# Patient Record
Sex: Male | Born: 1955 | Race: White | Hispanic: No | Marital: Married | Smoking: Never smoker
Health system: Southern US, Community
[De-identification: ages and names within clinical notes are randomized; demographics above are authoritative.]

## PROBLEM LIST (undated history)

## (undated) DIAGNOSIS — E878 Other disorders of electrolyte and fluid balance, not elsewhere classified: Secondary | ICD-10-CM

## (undated) DIAGNOSIS — R42 Dizziness and giddiness: Secondary | ICD-10-CM

## (undated) DIAGNOSIS — E039 Hypothyroidism, unspecified: Secondary | ICD-10-CM

## (undated) DIAGNOSIS — E079 Disorder of thyroid, unspecified: Secondary | ICD-10-CM

## (undated) DIAGNOSIS — Z86718 Personal history of other venous thrombosis and embolism: Secondary | ICD-10-CM

## (undated) DIAGNOSIS — S36039A Unspecified laceration of spleen, initial encounter: Secondary | ICD-10-CM

## (undated) DIAGNOSIS — E7800 Pure hypercholesterolemia, unspecified: Secondary | ICD-10-CM

## (undated) HISTORY — DX: Dizziness and giddiness: R42

## (undated) HISTORY — DX: Other disorders of electrolyte and fluid balance, not elsewhere classified: E87.8

## (undated) HISTORY — DX: Personal history of other venous thrombosis and embolism: Z86.718

## (undated) HISTORY — PX: LUMBAR DISC SURGERY: SHX700

## (undated) HISTORY — DX: Unspecified laceration of spleen, initial encounter: S36.039A

## (undated) HISTORY — PX: CHOLECYSTECTOMY: SHX55

## (undated) HISTORY — DX: Disorder of thyroid, unspecified: E07.9

## (undated) HISTORY — PX: OTHER SURGICAL HISTORY: SHX169

## (undated) HISTORY — PX: CARPAL TUNNEL RELEASE: SHX101

## (undated) HISTORY — PX: COLONOSCOPY: SHX174

---

## 2001-05-21 ENCOUNTER — Ambulatory Visit (HOSPITAL_COMMUNITY): Admission: RE | Admit: 2001-05-21 | Discharge: 2001-05-21 | Payer: Self-pay | Admitting: Internal Medicine

## 2001-09-19 ENCOUNTER — Ambulatory Visit (HOSPITAL_BASED_OUTPATIENT_CLINIC_OR_DEPARTMENT_OTHER): Admission: RE | Admit: 2001-09-19 | Discharge: 2001-09-19 | Payer: Self-pay | Admitting: Orthopedic Surgery

## 2004-04-03 ENCOUNTER — Ambulatory Visit: Admission: RE | Admit: 2004-04-03 | Discharge: 2004-04-03 | Payer: Self-pay | Admitting: Internal Medicine

## 2004-05-31 ENCOUNTER — Ambulatory Visit (HOSPITAL_COMMUNITY): Admission: RE | Admit: 2004-05-31 | Discharge: 2004-06-01 | Payer: Self-pay | Admitting: Neurological Surgery

## 2007-02-24 ENCOUNTER — Ambulatory Visit (HOSPITAL_COMMUNITY): Admission: RE | Admit: 2007-02-24 | Discharge: 2007-02-24 | Payer: Self-pay | Admitting: Obstetrics and Gynecology

## 2007-03-12 ENCOUNTER — Inpatient Hospital Stay (HOSPITAL_COMMUNITY): Admission: EM | Admit: 2007-03-12 | Discharge: 2007-04-04 | Payer: Self-pay | Admitting: Emergency Medicine

## 2007-03-19 ENCOUNTER — Ambulatory Visit: Payer: Self-pay | Admitting: Vascular Surgery

## 2007-03-19 ENCOUNTER — Encounter (INDEPENDENT_AMBULATORY_CARE_PROVIDER_SITE_OTHER): Payer: Self-pay | Admitting: General Surgery

## 2007-04-30 ENCOUNTER — Encounter (HOSPITAL_COMMUNITY): Admission: RE | Admit: 2007-04-30 | Discharge: 2007-05-30 | Payer: Self-pay | Admitting: Orthopedic Surgery

## 2007-05-16 ENCOUNTER — Ambulatory Visit (HOSPITAL_COMMUNITY): Admission: RE | Admit: 2007-05-16 | Discharge: 2007-05-16 | Payer: Self-pay | Admitting: Internal Medicine

## 2007-05-22 ENCOUNTER — Encounter: Admission: RE | Admit: 2007-05-22 | Discharge: 2007-05-22 | Payer: Self-pay | Admitting: Internal Medicine

## 2007-05-28 ENCOUNTER — Inpatient Hospital Stay (HOSPITAL_COMMUNITY): Admission: AD | Admit: 2007-05-28 | Discharge: 2007-05-30 | Payer: Self-pay | Admitting: Interventional Radiology

## 2007-06-04 ENCOUNTER — Observation Stay (HOSPITAL_COMMUNITY): Admission: AD | Admit: 2007-06-04 | Discharge: 2007-06-06 | Payer: Self-pay | Admitting: Internal Medicine

## 2007-06-11 ENCOUNTER — Encounter: Admission: RE | Admit: 2007-06-11 | Discharge: 2007-06-11 | Payer: Self-pay | Admitting: Interventional Radiology

## 2007-07-10 ENCOUNTER — Encounter (HOSPITAL_COMMUNITY): Admission: RE | Admit: 2007-07-10 | Discharge: 2007-07-30 | Payer: Self-pay | Admitting: Orthopedic Surgery

## 2007-08-01 ENCOUNTER — Encounter (HOSPITAL_COMMUNITY): Admission: RE | Admit: 2007-08-01 | Discharge: 2007-08-31 | Payer: Self-pay | Admitting: Orthopedic Surgery
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
## 2009-01-09 ENCOUNTER — Emergency Department (HOSPITAL_COMMUNITY): Admission: EM | Admit: 2009-01-09 | Discharge: 2009-01-09 | Payer: Self-pay | Admitting: Emergency Medicine

## 2009-01-10 ENCOUNTER — Inpatient Hospital Stay (HOSPITAL_COMMUNITY): Admission: EM | Admit: 2009-01-10 | Discharge: 2009-01-14 | Payer: Self-pay | Admitting: Emergency Medicine

## 2009-01-10 ENCOUNTER — Ambulatory Visit (HOSPITAL_COMMUNITY): Admission: RE | Admit: 2009-01-10 | Discharge: 2009-01-10 | Payer: Self-pay | Admitting: Emergency Medicine

## 2009-01-12 ENCOUNTER — Encounter (INDEPENDENT_AMBULATORY_CARE_PROVIDER_SITE_OTHER): Payer: Self-pay | Admitting: Surgery

## 2010-05-26 ENCOUNTER — Ambulatory Visit (HOSPITAL_COMMUNITY): Admission: RE | Admit: 2010-05-26 | Discharge: 2010-05-26 | Payer: Self-pay | Admitting: Internal Medicine

## 2010-11-20 ENCOUNTER — Encounter: Payer: Self-pay | Admitting: Internal Medicine

## 2011-02-09 LAB — CBC
HCT: 38.4 % — ABNORMAL LOW (ref 39.0–52.0)
HCT: 41.6 % (ref 39.0–52.0)
Hemoglobin: 11.1 g/dL — ABNORMAL LOW (ref 13.0–17.0)
Hemoglobin: 11.5 g/dL — ABNORMAL LOW (ref 13.0–17.0)
Hemoglobin: 12.5 g/dL — ABNORMAL LOW (ref 13.0–17.0)
MCHC: 35.3 g/dL (ref 30.0–36.0)
MCHC: 35.5 g/dL (ref 30.0–36.0)
MCHC: 34.8 g/dL (ref 30.0–36.0)
MCV: 98.7 fL (ref 78.0–100.0)
MCV: 99.6 fL (ref 78.0–100.0)
MCV: 100.4 fL — ABNORMAL HIGH (ref 78.0–100.0)
Platelets: 178 10*3/uL (ref 150–400)
Platelets: 179 10*3/uL (ref 150–400)
Platelets: 179 10*3/uL (ref 150–400)
RBC: 3.15 MIL/uL — ABNORMAL LOW (ref 4.22–5.81)
RBC: 3.54 MIL/uL — ABNORMAL LOW (ref 4.22–5.81)
RBC: 3.83 MIL/uL — ABNORMAL LOW (ref 4.22–5.81)
RDW: 12.4 % (ref 11.5–15.5)
RDW: 12.7 % (ref 11.5–15.5)
RDW: 12.4 % (ref 11.5–15.5)
WBC: 5.6 10*3/uL (ref 4.0–10.5)
WBC: 11.8 10*3/uL — ABNORMAL HIGH (ref 4.0–10.5)
WBC: 10.4 10*3/uL (ref 4.0–10.5)

## 2011-02-09 LAB — LIPASE, BLOOD: Lipase: 13 U/L (ref 11–59)

## 2011-02-09 LAB — COMPREHENSIVE METABOLIC PANEL
ALT: 57 U/L — ABNORMAL HIGH (ref 0–53)
ALT: 21 U/L (ref 0–53)
Albumin: 3.1 g/dL — ABNORMAL LOW (ref 3.5–5.2)
Albumin: 3.6 g/dL (ref 3.5–5.2)
Alkaline Phosphatase: 74 U/L (ref 39–117)
Alkaline Phosphatase: 46 U/L (ref 39–117)
Alkaline Phosphatase: 48 U/L (ref 39–117)
BUN: 14 mg/dL (ref 6–23)
CO2: 27 mEq/L (ref 19–32)
Calcium: 8.4 mg/dL (ref 8.4–10.5)
Calcium: 8.6 mg/dL (ref 8.4–10.5)
Creatinine, Ser: 1.24 mg/dL (ref 0.4–1.5)
GFR calc Af Amer: >60 mL/min (ref >60)
GFR calc non Af Amer: >60 mL/min (ref >60)
Glucose, Bld: 105 mg/dL — ABNORMAL HIGH (ref 70–99)
Potassium: 3.3 mEq/L — ABNORMAL LOW (ref 3.5–5.1)
Potassium: 3.6 mEq/L (ref 3.5–5.1)
Potassium: 4.2 mEq/L (ref 3.5–5.1)
Sodium: 140 mEq/L (ref 135–145)
Sodium: 137 mEq/L (ref 135–145)
Total Protein: 5.7 g/dL — ABNORMAL LOW (ref 6.0–8.3)
Total Protein: 6.9 g/dL (ref 6.0–8.3)
Total Protein: 7.4 g/dL (ref 6.0–8.3)

## 2011-02-09 LAB — URINALYSIS, ROUTINE W REFLEX MICROSCOPIC
Glucose, UA: NEGATIVE mg/dL
Ketones, ur: NEGATIVE mg/dL
Leukocytes, UA: NEGATIVE
Nitrite: NEGATIVE
Protein, ur: NEGATIVE mg/dL
pH: 5.5 (ref 5.0–8.0)

## 2011-02-09 LAB — PREPARE FRESH FROZEN PLASMA

## 2011-02-09 LAB — BASIC METABOLIC PANEL
BUN: 10 mg/dL (ref 6–23)
Creatinine, Ser: 0.87 mg/dL (ref 0.4–1.5)
GFR calc non Af Amer: >60 mL/min (ref >60)
Glucose, Bld: 149 mg/dL — ABNORMAL HIGH (ref 70–99)

## 2011-02-09 LAB — DIFFERENTIAL
Basophils Absolute: 0 10*3/uL (ref 0.0–0.1)
Eosinophils Absolute: 0 10*3/uL (ref 0.0–0.7)
Eosinophils Relative: 0 % (ref 0–5)
Lymphocytes Relative: 11 % — ABNORMAL LOW (ref 12–46)
Lymphocytes Relative: 14 % (ref 12–46)
Lymphs Abs: 1.5 10*3/uL (ref 0.7–4.0)
Lymphs Abs: 1.4 10*3/uL (ref 0.7–4.0)
Monocytes Absolute: 1.4 10*3/uL — ABNORMAL HIGH (ref 0.1–1.0)
Monocytes Relative: 10 % (ref 3–12)
Neutro Abs: 11.2 10*3/uL — ABNORMAL HIGH (ref 1.7–7.7)
Neutrophils Relative %: 79 % — ABNORMAL HIGH (ref 43–77)
Neutrophils Relative %: 76 % (ref 43–77)

## 2011-02-09 LAB — APTT
aPTT: 55 seconds — ABNORMAL HIGH (ref 24–37)
aPTT: 58 seconds — ABNORMAL HIGH (ref 24–37)

## 2011-02-09 LAB — PROTIME-INR
INR: 1.8 — ABNORMAL HIGH (ref 0.00–1.49)
INR: 2.4 — ABNORMAL HIGH (ref 0.00–1.49)
Prothrombin Time: 21.6 seconds — ABNORMAL HIGH (ref 11.6–15.2)
Prothrombin Time: 26 seconds — ABNORMAL HIGH (ref 11.6–15.2)

## 2011-02-09 LAB — AMYLASE: Amylase: 20 U/L — ABNORMAL LOW (ref 27–131)

## 2011-02-09 LAB — URINE MICROSCOPIC-ADD ON
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
## 2011-03-14 NOTE — Discharge Summary (Signed)
[name], [name]             ACCOUNT NO.:  [id number]   MEDICAL RECORD NO.:  [id number]          PATIENT TYPE:  OBV   LOCATION:  A327                          FACILITY:  [facility]   PHYSICIAN:  Kingsley Callander. Ouida Sills, MD       DATE OF BIRTH:  [birth year]   DATE OF ADMISSION:  06/04/2007  DATE OF DISCHARGE:  08/07/2008LH                               DISCHARGE SUMMARY   DISCHARGE DIAGNOSES:  1. Anemia.  2. Left chest wall hematoma.  3. 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  4. Hypothyroidism.  5. History of urinary retention.  6. History of the left rib scapula and clavicle fractures.  7. Sleep apnea.  8. Hyperlipidemia.   PROCEDURES:  CT of chest, abdomen and pelvis.   HOSPITAL COURSE:  This patient is a 55 year old white male who is 1 week  out from thrombectomy and thrombolysis for lower extremity iliac and IVC  DVT who presented with an anemia.  His hemoglobin was 5.8.  Coumadin was  held.  He was transfused initially with 3 units of packed red cells.  His hemoglobin improved to 7.3.  He was transfused with 2 more units of  packed red cells up to a hemoglobin of 9.0.  His INR initially was 3.6  and dropped to 3.0, then to 2.0.  A CT scan of the abdomen and pelvis  revealed no sign of retroperitoneal hemorrhage.  A CT scan of the chest  revealed a large chest wall hematoma in the lateral and posterior left  chest wall.  He had previously suffered fractures of the left 2nd  through 10th ribs, as well as fractures of the scapula and clavicle.  He  was hemodynamically stable.  He developed yellow discoloration type  bruising in the left lateral chest.  He has had significant improvement  in his lower extremity edema since his procedures last week.  He has had  no bleeding from his popliteal areas where the catheters had been  placed.  The plan is to try to keep his INR in a low therapeutic range.  He will have a followup hemoglobin tomorrow.  He will be  discharged  today.  An INR an H&H will be obtained tomorrow.   DISCHARGE MEDICATIONS:  1. Coumadin 7.5 mg daily.  2. Synthroid 175 mcg daily.  3. Flomax 0.4 mg daily.  4. Avodart 0.5 mg daily.  5. Repliva daily.  6. Multivitamin daily.  7. Robaxin as needed.      Kingsley Callander. Ouida Sills, MD  Electronically Signed     ROF/MEDQ  D:  06/06/2007  T:  06/06/2007  Job:  [id number]   cc:   Sherrine Maples T. Fredia Sorrow, M.D.  Fax: [phone]   Kingsley Callander. Ouida Sills, MD  Fax: [phone]   Doralee Albino. Carola Frost, M.D.  Fax: [phone]
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
## 2011-03-14 NOTE — Op Note (Signed)
[name], [name] NO.:  [id number]   MEDICAL RECORD NO.:  [id number]           PATIENT TYPE:   LOCATION:                                 FACILITY:   PHYSICIAN:  Currie Paris, M.D.DATE OF BIRTH:  [birth year]   DATE OF PROCEDURE:  01/12/2009  DATE OF DISCHARGE:                               OPERATIVE REPORT   PREOPERATIVE DIAGNOSIS:  Acute calculous cholecystitis.   POSTOPERATIVE DIAGNOSIS:  Acute calculous cholecystitis.   OPERATION:  Laparoscopic cholecystectomy with operative cholangiogram.   SURGEON:  Currie Paris, MD   ASSISTANT:  Dr. Janee Morn.   ANESTHESIA:  General endotracheal.   CLINICAL HISTORY:  This is a 55 year old gentleman presented with signs  and symptoms of acute cholecystitis.  Because of a history of DVT, he  has been anticoagulated on Coumadin.  We got his INR corrected and  brought him to the operating room today.   DESCRIPTION OF PROCEDURE:  The patient was seen in the holding area and  he had no further questions.  We confirmed cholecystectomy as the  planned procedure.   The patient was taken to the operating room.  After satisfactory general  endotracheal anesthesia had been obtained, the abdomen was prepped and  draped.  The time-out was done.   I used 0.25% plain Marcaine for each incision.  An incision was made  just above the umbilicus because the patient was quite large and I  thought this would gave [place] more access with the camera.  The fascia was  opened under direct vision, and the peroneal cavity entered.  A  pursestring was placed.  The abdomen was insufflated to 15.   The patient was placed in reverse Trendelenburg and tilted to the left.  The omentum was noted to be stuck to the gallbladder, and I could see  the tip of the gallbladder which appeared gangrenous.   Using a combination of blunt and irrigation, I was able to get the  omentum off, although it was initially bleeding.  Once I got the  gallbladder visualized, I was able to decompress it.  With that done, I  was able to then retract it up over the liver and began to try to  dissect in the area of the triangle of Calot.  This area was very woody,  and we had difficulty grasping it here because it felt like there was a  stone in the ampulla of the gallbladder.  Nevertheless, I was able open  the very thickened peritoneum which was also somewhat necrotic both  anteriorly and posteriorly.  Then using very slow dissection with  suction and irrigation, finally I was able to dissect out a nice segment  of cystic duct, although it appeared almost necrotic.  Once I had its  junction with the gallbladder clearly identified and I had a segment  about 2.5 to 3 cm long, I ceased any further dissection and did not try  to dissect down further towards the common duct.  At this point, I had  not identified the artery as I did have a lot of mobility to dissect  further up in the triangle of Calot.  I went ahead and clipped the  cystic duct and divided it.  I then put an Endoloop on the proximal end  of the cystic duct as a second secure closure.   With that done and further hydrodissection, I was able to bring the  gallbladder out of its bed, although again this was fairly necrotic.  I  saw what appeared initially to be a posterior branch of the artery which  I clipped and divided and then what appeared to be the anterior branch  which I clipped and divided.  The gallbladder was then removed from  below to above with some destruction of the wall as this was necrotic as  noted above.   Once I had it out, I placed it in a bag.  I spent several minutes  irrigating and making sure everything was dry.  There had been a fair  amount of oozing near the dissection initially around the cystic duct  which had caused some clots to occur, but we got all those sucked out  and everything looked dry.  I did go ahead and put some Surgicel in the  area  where there had been oozing previously.   The gallbladder was brought out the umbilical site.  I had to enlarge it  in order to get it out and then closed it with figure-of-eight 0  Prolene.   We reinsufflated and made a final check and everything appeared dry  around the bed of the gallbladder.  I did place a 19 Blake drain and  bring it out the lateral port prior to removing the umbilical port.   The lateral port remaining was removed.  The abdomen was deflated  through the epigastric port.  Skin was closed with 4-0 Monocryl  subcuticular plus Dermabond.   The patient tolerated the procedure well.  There were no operative  complications.  All counts were correct.      Currie Paris, M.D.  Electronically Signed     CJS/MEDQ  D:  01/12/2009  T:  01/13/2009  Job:  [id number]
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
## 2011-03-17 NOTE — Procedures (Signed)
NAME:  [name], [name] NO.:  [id number]   MEDICAL RECORD NO.:  [id number]          PATIENT TYPE:  OUT   LOCATION:  SLEEP LAB                     FACILITY:  [facility]   PHYSICIAN:  Marcelyn Bruins, M.D. [facility] DATE OF BIRTH:  [birth year]   DATE OF ADMISSION:  04/03/2004  DATE OF DISCHARGE:  04/03/2004                              NOCTURNAL POLYSOMNOGRAM   REFERRING PHYSICIAN:  Dr. Carylon Perches   INDICATION FOR STUDY:  Hypersomnia with sleep apnea.  Epworth sleepiness  score is 10.   SLEEP ARCHITECTURE:  Total sleep time is 390 minutes.  The patient had a  normal sleep latency at 9.5 minutes and a prolonged REM latency at 124  minutes.  There was decreased REM and slow-wave sleep.   RESPIRATORY DATA:  The patient was found to have 163 obstructive events in  the first 82 minutes of sleep.  This gave him a respiratory disturbance  index of 120 events per hour.  The patient, therefore, met split night  protocol and was initiated on CPAP with a medium comfort select gel mask.  CPAP was then titrated to a final pressure of 12 cm with mild REM  breakthrough events.   OXYGEN DATA:  Minimum O2 desaturation was 84%.   IMPRESSION/RECOMMENDATIONS:  1. Very severe obstructive sleep apnea, hypopnea syndrome, which appears to     respond very well to CPAP.  Because of the REM breakthroughs, I would     recommend a final treatment pressure of 13 cm.  Events were not     positional during the study and there was mild O2 desaturation noted.   2.  Very loud snoring prior to CPAP initiation.   3.  Very large numbers of leg jerks with minimal sleep disruption.  Clinical  correlation is suggested.   4.  Occasional PVC noted.                                   ______________________________                                Marcelyn Bruins, M.D. [facility]    KC/MEDQ  D:  05/12/2004 15:32:54  T:  05/13/2004 15:30:33  Job:  [id number]

## 2011-08-14 LAB — CROSSMATCH: ABO/RH(D): A POS

## 2011-08-14 LAB — CBC
Hemoglobin: 8.9 — ABNORMAL LOW
Hemoglobin: 9.7 — ABNORMAL LOW
MCHC: 33
MCHC: 33.4
MCHC: 33.7
MCV: 93.3
Platelets: 270
Platelets: 276
Platelets: 298
Platelets: 300
Platelets: 313
RBC: 2.86 — ABNORMAL LOW
RBC: 2.99 — ABNORMAL LOW
RBC: 3.03 — ABNORMAL LOW
RBC: 3.04 — ABNORMAL LOW
RBC: 3.19 — ABNORMAL LOW
RBC: 3.3 — ABNORMAL LOW
RDW: 18.5 — ABNORMAL HIGH
RDW: 18.8 — ABNORMAL HIGH
RDW: 19.2 — ABNORMAL HIGH
WBC: 4.8
WBC: 5
WBC: 5.2
WBC: 5.4
WBC: 5.9

## 2011-08-14 LAB — FIBRINOGEN
Fibrinogen: 272
Fibrinogen: 286
Fibrinogen: 320
Fibrinogen: 367
Fibrinogen: 372
Fibrinogen: 391

## 2011-08-14 LAB — COMPREHENSIVE METABOLIC PANEL
Alkaline Phosphatase: 70
Calcium: 9
Chloride: 107
Creatinine, Ser: 0.73
GFR calc Af Amer: >60
GFR calc non Af Amer: >60
Sodium: 142

## 2011-08-14 LAB — POTASSIUM: Potassium: 3.6

## 2011-08-14 LAB — PROTIME-INR
INR: 2 — ABNORMAL HIGH
INR: 2.7 — ABNORMAL HIGH
INR: 2.8 — ABNORMAL HIGH
Prothrombin Time: 24 — ABNORMAL HIGH
Prothrombin Time: 30.2 — ABNORMAL HIGH

## 2011-08-14 LAB — HEPARIN LEVEL (UNFRACTIONATED)
Heparin Unfractionated: 0.12 — ABNORMAL LOW
Heparin Unfractionated: 0.12 — ABNORMAL LOW
Heparin Unfractionated: 0.15 — ABNORMAL LOW
Heparin Unfractionated: 0.15 — ABNORMAL LOW
Heparin Unfractionated: 0.19 — ABNORMAL LOW

## 2011-08-14 LAB — HEMOGLOBIN AND HEMATOCRIT, BLOOD
HCT: 21 — ABNORMAL LOW
HCT: 26.1 — ABNORMAL LOW
Hemoglobin: 7.3 — CL
Hemoglobin: 9 — ABNORMAL LOW

## 2011-08-14 LAB — ABO/RH: ABO/RH(D): A POS

## 2011-08-14 LAB — BASIC METABOLIC PANEL
BUN: 4 — ABNORMAL LOW
Calcium: 8.3 — ABNORMAL LOW
Creatinine, Ser: 0.79
GFR calc Af Amer: >60
GFR calc non Af Amer: >60

## 2011-08-14 LAB — CREATININE, SERUM
Creatinine, Ser: 0.75
GFR calc non Af Amer: >60

## 2011-08-14 LAB — PREPARE RBC (CROSSMATCH)

## 2011-08-17 LAB — BASIC METABOLIC PANEL
BUN: 35 — ABNORMAL HIGH
CO2: 19
Calcium: 8.4
Chloride: 104
Creatinine, Ser: 2.26 — ABNORMAL HIGH
GFR calc non Af Amer: 31 — ABNORMAL LOW
GFR calc non Af Amer: >60
Glucose, Bld: 100 — ABNORMAL HIGH
Glucose, Bld: 112 — ABNORMAL HIGH
Glucose, Bld: 140 — ABNORMAL HIGH
Potassium: 3.6
Potassium: 4.1
Sodium: 130 — ABNORMAL LOW
Sodium: 134 — ABNORMAL LOW

## 2011-08-17 LAB — RENAL FUNCTION PANEL
Albumin: 2.5 — ABNORMAL LOW
Albumin: 2.6 — ABNORMAL LOW
BUN: 31 — ABNORMAL HIGH
CO2: 21
Calcium: 8.3 — ABNORMAL LOW
GFR calc Af Amer: >60
GFR calc non Af Amer: 59 — ABNORMAL LOW
Phosphorus: 5.8 — ABNORMAL HIGH
Potassium: 3.5
Sodium: 132 — ABNORMAL LOW
Sodium: 133 — ABNORMAL LOW

## 2011-08-17 LAB — CBC
HCT: 25.4 — ABNORMAL LOW
HCT: 26.7 — ABNORMAL LOW
HCT: 28.4 — ABNORMAL LOW
Hemoglobin: 8.8 — ABNORMAL LOW
Hemoglobin: 9.4 — ABNORMAL LOW
Hemoglobin: 9.9 — ABNORMAL LOW
MCHC: 34
MCHC: 34.8
MCHC: 35
MCV: 94
Platelets: 200
RBC: 2.79 — ABNORMAL LOW
RBC: 2.86 — ABNORMAL LOW
RDW: 14
RDW: 14
RDW: 14.2 — ABNORMAL HIGH
WBC: 8.7
WBC: 9.6

## 2012-03-26 ENCOUNTER — Ambulatory Visit (INDEPENDENT_AMBULATORY_CARE_PROVIDER_SITE_OTHER): Payer: 59 | Admitting: Internal Medicine

## 2012-03-26 ENCOUNTER — Encounter (INDEPENDENT_AMBULATORY_CARE_PROVIDER_SITE_OTHER): Payer: Self-pay | Admitting: Internal Medicine

## 2012-03-26 VITALS — BP 140/90 | HR 82 | Temp 98.4°F | Resp 20 | Ht 74.0 in | Wt 294.8 lb

## 2012-03-26 DIAGNOSIS — I82401 Acute embolism and thrombosis of unspecified deep veins of right lower extremity: Secondary | ICD-10-CM | POA: Insufficient documentation

## 2012-03-26 DIAGNOSIS — E039 Hypothyroidism, unspecified: Secondary | ICD-10-CM | POA: Insufficient documentation

## 2012-03-26 DIAGNOSIS — R197 Diarrhea, unspecified: Secondary | ICD-10-CM | POA: Insufficient documentation

## 2012-03-26 MED ORDER — DICYCLOMINE HCL 10 MG PO CAPS: 10.0000 mg | ORAL_CAPSULE | Freq: Three times a day (TID) | ORAL | Status: DC

## 2012-03-26 MED ORDER — ALIGN PO CAPS: 1.0000 | ORAL_CAPSULE | Freq: Every day | ORAL | Status: AC
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
## 2012-03-26 NOTE — Patient Instructions (Signed)
Align 1 capsule by mouth daily. New medication is dicyclomine 10 mg by mouth 30 minutes before each meal. Colonoscopy to be scheduled
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
## 2012-03-27 ENCOUNTER — Telehealth (INDEPENDENT_AMBULATORY_CARE_PROVIDER_SITE_OTHER): Payer: Self-pay | Admitting: *Deleted

## 2012-03-27 ENCOUNTER — Other Ambulatory Visit (INDEPENDENT_AMBULATORY_CARE_PROVIDER_SITE_OTHER): Payer: Self-pay | Admitting: *Deleted

## 2012-03-27 DIAGNOSIS — Z1211 Encounter for screening for malignant neoplasm of colon: Secondary | ICD-10-CM

## 2012-03-27 DIAGNOSIS — R197 Diarrhea, unspecified: Secondary | ICD-10-CM

## 2012-03-27 NOTE — Telephone Encounter (Signed)
Patient needs trilyte 

## 2012-03-28 MED ORDER — PEG 3350-KCL-NA BICARB-NACL 420 G PO SOLR: 4000.0000 mL | Freq: Once | ORAL | Status: AC

## 2012-04-01 ENCOUNTER — Encounter (INDEPENDENT_AMBULATORY_CARE_PROVIDER_SITE_OTHER): Payer: Self-pay

## 2012-04-11 ENCOUNTER — Encounter (HOSPITAL_COMMUNITY): Payer: Self-pay | Admitting: Pharmacy Technician

## 2012-04-17 MED ORDER — SODIUM CHLORIDE 0.45 % IV SOLN: Freq: Once | INTRAVENOUS | Status: DC

## 2012-04-18 ENCOUNTER — Encounter (HOSPITAL_COMMUNITY)
Admission: RE | Disposition: A | Discharge status: Home / Self Care | Payer: Self-pay | Source: Ambulatory Visit | Attending: Internal Medicine

## 2012-04-18 ENCOUNTER — Encounter (HOSPITAL_COMMUNITY): Payer: Self-pay | Admitting: *Deleted

## 2012-04-18 ENCOUNTER — Ambulatory Visit (HOSPITAL_COMMUNITY)
Admission: RE | Admit: 2012-04-18 | Discharge: 2012-04-18 | Disposition: A | Discharge status: Home / Self Care | Payer: 59 | Source: Ambulatory Visit | Attending: Internal Medicine | Admitting: Internal Medicine

## 2012-04-18 DIAGNOSIS — D126 Benign neoplasm of colon, unspecified: Secondary | ICD-10-CM

## 2012-04-18 DIAGNOSIS — R197 Diarrhea, unspecified: Secondary | ICD-10-CM | POA: Insufficient documentation

## 2012-04-18 DIAGNOSIS — E7800 Pure hypercholesterolemia, unspecified: Secondary | ICD-10-CM | POA: Insufficient documentation

## 2012-04-18 HISTORY — DX: Pure hypercholesterolemia, unspecified: E78.00

## 2012-04-18 HISTORY — PX: COLONOSCOPY: SHX5424

## 2012-04-18 HISTORY — DX: Hypothyroidism, unspecified: E03.9

## 2012-04-18 SURGERY — COLONOSCOPY
Anesthesia: Moderate Sedation

## 2012-04-18 MED ORDER — MEPERIDINE HCL 50 MG/ML IJ SOLN
INTRAMUSCULAR | Status: DC | PRN
  Administered 2012-04-18 (×2): 25 mg via INTRAVENOUS

## 2012-04-18 MED ORDER — MIDAZOLAM HCL 5 MG/5ML IJ SOLN
INTRAMUSCULAR | Status: AC
  Filled 2012-04-18: qty 10

## 2012-04-18 MED ORDER — MIDAZOLAM HCL 5 MG/5ML IJ SOLN
INTRAMUSCULAR | Status: DC | PRN
  Administered 2012-04-18 (×3): 2 mg via INTRAVENOUS

## 2012-04-18 MED ORDER — STERILE WATER FOR IRRIGATION IR SOLN
Status: DC | PRN
  Administered 2012-04-18: 14:00:00

## 2012-04-18 MED ORDER — MEPERIDINE HCL 50 MG/ML IJ SOLN
INTRAMUSCULAR | Status: AC
  Filled 2012-04-18: qty 1
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
## 2012-04-18 NOTE — Discharge Instructions (Signed)
Resume usual medications and diet including warfarin and Lovenox. No driving for 24 hours. Physician will contact you with biopsy results   Colonoscopy Care After Read the instructions outlined below and refer to this sheet in the next few weeks. These discharge instructions provide you with general information on caring for yourself after you leave the hospital. Your doctor may also give you specific instructions. While your treatment has been planned according to the most current medical practices available, unavoidable complications occasionally occur. If you have any problems or questions after discharge, call your doctor. HOME CARE INSTRUCTIONS ACTIVITY:  You may resume your regular activity, but move at a slower pace for the next 24 hours.   Take frequent rest periods for the next 24 hours.   Walking will help get rid of the air and reduce the bloated feeling in your belly (abdomen).   No driving for 24 hours (because of the medicine (anesthesia) used during the test).   You may shower.   Do not sign any important legal documents or operate any machinery for 24 hours (because of the anesthesia used during the test).  NUTRITION:  Drink plenty of fluids.   You may resume your normal diet as instructed by your doctor.   Begin with a light meal and progress to your normal diet. Heavy or fried foods are harder to digest and may make you feel sick to your stomach (nauseated).   Avoid alcoholic beverages for 24 hours or as instructed.  MEDICATIONS:  You may resume your normal medications unless your doctor tells you otherwise.  WHAT TO EXPECT TODAY:  Some feelings of bloating in the abdomen.   Passage of more gas than usual.   Spotting of blood in your stool or on the toilet paper.  IF YOU HAD POLYPS REMOVED DURING THE COLONOSCOPY:  No aspirin products for 7 days or as instructed.   No alcohol for 7 days or as instructed.   Eat a soft diet for the next 24 hours.    FINDING OUT THE RESULTS OF YOUR TEST Not all test results are available during your visit. If your test results are not back during the visit, make an appointment with your caregiver to find out the results. Do not assume everything is normal if you have not heard from your caregiver or the medical facility. It is important for you to follow up on all of your test results.  SEEK IMMEDIATE MEDICAL CARE IF:  You have more than a spotting of blood in your stool.   Your belly is swollen (abdominal distention).   You are nauseated or vomiting.   You have a fever.   You have abdominal pain or discomfort that is severe or gets worse throughout the day.  Document Released: 05/30/2004 Document Revised: 10/05/2011 Document Reviewed: 05/28/2008 [facility] Patient Information 2012 [place], [place].   PATIENT INSTRUCTIONS POST-ANESTHESIA  IMMEDIATELY FOLLOWING SURGERY:  Do not drive or operate machinery for the first twenty four hours after surgery.  Do not make any important decisions for twenty four hours after surgery or while taking narcotic pain medications or sedatives.  If you develop intractable nausea and vomiting or a severe headache please notify your doctor immediately.  FOLLOW-UP:  Please make an appointment with your surgeon as instructed. You do not need to follow up with anesthesia unless specifically instructed to do so.  WOUND CARE INSTRUCTIONS (if applicable):  Keep a dry clean dressing on the anesthesia/puncture wound site if there is drainage.  Once  the wound has quit draining you may leave it open to air.  Generally you should leave the bandage intact for twenty four hours unless there is drainage.  If the epidural site drains for more than 36-48 hours please call the anesthesia department.  QUESTIONS?:  Please feel free to call your physician or the hospital operator if you have any questions, and they will be happy to assist you.

## 2012-04-18 NOTE — Op Note (Signed)
COLONOSCOPY PROCEDURE REPORT  PATIENT:  [name]  MR#:  [id number] Birthdate:  October 21, 1956, 56 y.o., male Endoscopist:  Dr. Malissa Hippo, MD Referred By:  Dr. Carylon Perches, MD Procedure Date: 04/18/2012  Procedure:   Colonoscopy  Indications:  Patient is 56 year old Caucasian male who has chronic diarrhea presumed to be due to IBS. He is undergoing diagnostic colonoscopy..  Informed Consent:  The procedure and risks were reviewed with the patient and informed consent was obtained.  Medications:  Demerol 50 mg IV Versed 6 mg IV  Description of procedure:  After a digital rectal exam was performed, that colonoscope was advanced from the anus through the rectum and colon to the area of the cecum, ileocecal valve and appendiceal orifice. The cecum was deeply intubated. These structures were well-seen and photographed for the record. From the level of the cecum and ileocecal valve, the scope was slowly and cautiously withdrawn. The mucosal surfaces were carefully surveyed utilizing scope tip to flexion to facilitate fold flattening as needed. The scope was pulled down into the rectum where a thorough exam including retroflexion was performed. Terminal ileum was also examined.  Findings:   Prep excellent. Normal terminal ileum. Millimeter polyp ablated via cold biopsy from proximal sigmoid colon. Normal rectal mucosa and anorectal junction.  Therapeutic/Diagnostic Maneuvers Performed:  See above  Complications:  None  Cecal Withdrawal Time:  18 minutes  Impression:  Normal terminal ileum. No endoscopic colitis. Small polyp ablated via cold biopsy from proximal sigmoid colon.   Recommendations:  Standard instructions given. I will contact patient with biopsy results and further recommendations. Continue dicyclomine 10 mg by mouth twice a day.  [name] U  04/18/2012 2:44 PM  CC: Dr. Carylon Perches, MD & Dr. Bonnetta Barry ref. provider found

## 2012-04-22 ENCOUNTER — Encounter (HOSPITAL_COMMUNITY): Payer: Self-pay | Admitting: Internal Medicine

## 2012-04-25 ENCOUNTER — Encounter (INDEPENDENT_AMBULATORY_CARE_PROVIDER_SITE_OTHER): Payer: Self-pay | Admitting: *Deleted

## 2012-10-02 ENCOUNTER — Encounter (INDEPENDENT_AMBULATORY_CARE_PROVIDER_SITE_OTHER): Payer: Self-pay | Admitting: *Deleted

## 2012-10-14 ENCOUNTER — Encounter (INDEPENDENT_AMBULATORY_CARE_PROVIDER_SITE_OTHER): Payer: Self-pay | Admitting: Internal Medicine

## 2012-10-14 ENCOUNTER — Ambulatory Visit (INDEPENDENT_AMBULATORY_CARE_PROVIDER_SITE_OTHER): Payer: 59 | Admitting: Internal Medicine

## 2012-10-14 VITALS — BP 146/70 | HR 72 | Temp 98.4°F | Ht 74.0 in | Wt 305.9 lb

## 2012-10-14 DIAGNOSIS — K589 Irritable bowel syndrome without diarrhea: Secondary | ICD-10-CM
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
## 2012-10-14 NOTE — Patient Instructions (Addendum)
OV prn 

## 2012-10-16 ENCOUNTER — Ambulatory Visit (INDEPENDENT_AMBULATORY_CARE_PROVIDER_SITE_OTHER): Payer: 59 | Admitting: Internal Medicine

## 2013-05-19 ENCOUNTER — Other Ambulatory Visit (INDEPENDENT_AMBULATORY_CARE_PROVIDER_SITE_OTHER): Payer: Self-pay | Admitting: Internal Medicine

## 2013-05-22 NOTE — Telephone Encounter (Signed)
Apt has been scheduled for 08/11/13 with Dr. Karilyn Cota.

## 2013-08-01 ENCOUNTER — Encounter (INDEPENDENT_AMBULATORY_CARE_PROVIDER_SITE_OTHER): Payer: Self-pay | Admitting: Internal Medicine

## 2013-08-01 ENCOUNTER — Ambulatory Visit (INDEPENDENT_AMBULATORY_CARE_PROVIDER_SITE_OTHER): Payer: 59 | Admitting: Internal Medicine

## 2013-08-01 VITALS — BP 130/76 | HR 80 | Temp 97.0°F | Resp 18 | Ht 74.0 in | Wt 315.7 lb

## 2013-08-01 DIAGNOSIS — K589 Irritable bowel syndrome without diarrhea: Secondary | ICD-10-CM

## 2013-08-01 DIAGNOSIS — E669 Obesity, unspecified: Secondary | ICD-10-CM | POA: Insufficient documentation

## 2013-08-01 NOTE — Progress Notes (Signed)
Presenting complaint;  Followup for irritable bowel syndrome.  Subjective:  [name] is 57 year old Caucasian male who presents for yearly visit. He was initially evaluated in May 2013 for chronic diarrhea bloating and abdominal rumbling. Colonoscopy in June 2013 revealed normal appearing mucosa. Single small polyp was removed from the sigmoid colon and it was adenoma. He's been maintained on dicyclomine and probiotic. He feels fine. His bowels move daily. He has some abdominal rumbling but without pain or diarrhea. He denies rectal bleeding. He has gained 21 pounds since his visit 16 months ago. At times he has dry mouth in the evening but he is not having any other side effects with the dicyclomine  Current Medications: Current Outpatient Prescriptions  Medication Sig Dispense Refill  . atorvastatin (LIPITOR) 10 MG tablet Take 10 mg by mouth at bedtime.       . dicyclomine (BENTYL) 10 MG capsule TAKE ONE (1) CAPSULE THREE (3) TIMES EACH DAY BETWEEN MEALS  90 capsule  3  . levothyroxine (SYNTHROID) 200 MCG tablet Take 200 mcg by mouth See admin instructions. Take with 200 mcg tablet of levothyroxine      . levothyroxine (SYNTHROID, LEVOTHROID) 25 MCG tablet Take 25 mcg by mouth See admin instructions. Take with 200 mcg tablet of levothyroxine      . Probiotic Product (ALIGN) 4 MG CAPS Take by mouth daily.      Marland Kitchen warfarin (COUMADIN) 6 MG tablet Take 6 mg by mouth daily.      Marland Kitchen dicyclomine (BENTYL) 10 MG capsule Take 10 mg by mouth 2 (two) times daily.       No current facility-administered medications for this visit.     Objective: Blood pressure 130/76, pulse 80, temperature 97 F (36.1 C), temperature source Oral, resp. rate 18, height 6\' 2"  (1.88 m), weight 315 lb 11.2 oz (143.201 kg). Conjunctiva is pink. Sclera is nonicteric Oropharyngeal mucosa is normal. No neck masses or thyromegaly noted. Abdomen is obese with normal bowel sounds. It is soft and nontender without organomegaly or  masses. No LE edema or clubbing noted.   Assessment:  #1. Irritable bowel syndrome. He is doing well with therapy. #2. Obesity. Has gained 21 pounds her last 16 months. Patient is trying to get back into this rhythm of daily exercise.  Plan:  Patient will call if medication quits working. Office visit in one year.

## 2013-08-01 NOTE — Patient Instructions (Signed)
Can try different probiotic as discussed. Call if symptoms relapse

## 2013-08-11 ENCOUNTER — Ambulatory Visit (INDEPENDENT_AMBULATORY_CARE_PROVIDER_SITE_OTHER): Payer: 59 | Admitting: Internal Medicine

## 2013-11-27 ENCOUNTER — Other Ambulatory Visit (INDEPENDENT_AMBULATORY_CARE_PROVIDER_SITE_OTHER): Payer: Self-pay | Admitting: Internal Medicine

## 2014-01-08 ENCOUNTER — Ambulatory Visit (INDEPENDENT_AMBULATORY_CARE_PROVIDER_SITE_OTHER): Payer: 59 | Admitting: Otolaryngology

## 2014-01-08 DIAGNOSIS — H902 Conductive hearing loss, unspecified: Secondary | ICD-10-CM

## 2014-01-08 DIAGNOSIS — H6120 Impacted cerumen, unspecified ear: Secondary | ICD-10-CM

## 2014-04-14 ENCOUNTER — Encounter (INDEPENDENT_AMBULATORY_CARE_PROVIDER_SITE_OTHER): Payer: Self-pay | Admitting: *Deleted

## 2014-08-11 ENCOUNTER — Encounter (INDEPENDENT_AMBULATORY_CARE_PROVIDER_SITE_OTHER): Payer: Self-pay | Admitting: Internal Medicine

## 2014-08-11 ENCOUNTER — Ambulatory Visit (INDEPENDENT_AMBULATORY_CARE_PROVIDER_SITE_OTHER): Payer: 59 | Admitting: Internal Medicine

## 2014-08-11 VITALS — BP 146/68 | HR 72 | Temp 97.7°F | Ht 75.0 in | Wt 301.0 lb

## 2014-08-11 DIAGNOSIS — K589 Irritable bowel syndrome without diarrhea: Secondary | ICD-10-CM
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
## 2014-08-11 NOTE — Patient Instructions (Signed)
OV in 1 yr. Continue the Dicyclomine

## 2014-08-26 ENCOUNTER — Other Ambulatory Visit (INDEPENDENT_AMBULATORY_CARE_PROVIDER_SITE_OTHER): Payer: Self-pay | Admitting: Internal Medicine

## 2015-01-07 ENCOUNTER — Ambulatory Visit (INDEPENDENT_AMBULATORY_CARE_PROVIDER_SITE_OTHER): Payer: 59 | Admitting: Otolaryngology

## 2015-01-07 DIAGNOSIS — H6121 Impacted cerumen, right ear: Secondary | ICD-10-CM

## 2015-05-11 ENCOUNTER — Encounter (INDEPENDENT_AMBULATORY_CARE_PROVIDER_SITE_OTHER): Payer: Self-pay | Admitting: *Deleted

## 2015-05-31 ENCOUNTER — Other Ambulatory Visit (INDEPENDENT_AMBULATORY_CARE_PROVIDER_SITE_OTHER): Payer: Self-pay | Admitting: Internal Medicine

## 2015-08-12 ENCOUNTER — Ambulatory Visit (INDEPENDENT_AMBULATORY_CARE_PROVIDER_SITE_OTHER): Payer: 59 | Admitting: Internal Medicine

## 2015-08-12 ENCOUNTER — Encounter (INDEPENDENT_AMBULATORY_CARE_PROVIDER_SITE_OTHER): Payer: Self-pay | Admitting: Internal Medicine

## 2015-08-12 VITALS — BP 122/66 | HR 64 | Temp 97.4°F | Ht 74.0 in | Wt 296.4 lb

## 2015-08-12 DIAGNOSIS — K589 Irritable bowel syndrome without diarrhea: Secondary | ICD-10-CM
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
## 2015-08-12 NOTE — Patient Instructions (Addendum)
OV in 1 yr. 

## 2015-12-03 ENCOUNTER — Other Ambulatory Visit (INDEPENDENT_AMBULATORY_CARE_PROVIDER_SITE_OTHER): Payer: Self-pay | Admitting: Internal Medicine

## 2016-01-10 ENCOUNTER — Other Ambulatory Visit (INDEPENDENT_AMBULATORY_CARE_PROVIDER_SITE_OTHER): Payer: Self-pay | Admitting: Internal Medicine

## 2016-01-13 ENCOUNTER — Ambulatory Visit (INDEPENDENT_AMBULATORY_CARE_PROVIDER_SITE_OTHER): Payer: 59 | Admitting: Otolaryngology

## 2016-02-24 ENCOUNTER — Ambulatory Visit (INDEPENDENT_AMBULATORY_CARE_PROVIDER_SITE_OTHER): Payer: 59 | Admitting: Otolaryngology

## 2016-02-24 DIAGNOSIS — H6123 Impacted cerumen, bilateral: Secondary | ICD-10-CM

## 2016-04-21 ENCOUNTER — Ambulatory Visit (HOSPITAL_COMMUNITY)
Admission: RE | Admit: 2016-04-21 | Discharge: 2016-04-21 | Disposition: A | Discharge status: Home / Self Care | Payer: 59 | Source: Ambulatory Visit | Attending: Internal Medicine | Admitting: Internal Medicine

## 2016-04-21 ENCOUNTER — Other Ambulatory Visit (HOSPITAL_COMMUNITY): Payer: Self-pay | Admitting: Internal Medicine

## 2016-04-21 DIAGNOSIS — M25562 Pain in left knee: Secondary | ICD-10-CM

## 2016-04-21 DIAGNOSIS — R531 Weakness: Secondary | ICD-10-CM

## 2016-04-21 DIAGNOSIS — M25561 Pain in right knee: Secondary | ICD-10-CM | POA: Insufficient documentation

## 2016-04-21 DIAGNOSIS — M25762 Osteophyte, left knee: Secondary | ICD-10-CM | POA: Diagnosis not present

## 2016-05-10 ENCOUNTER — Encounter (INDEPENDENT_AMBULATORY_CARE_PROVIDER_SITE_OTHER): Payer: Self-pay | Admitting: Internal Medicine

## 2016-05-13 ENCOUNTER — Other Ambulatory Visit: Payer: Self-pay | Admitting: Internal Medicine

## 2016-05-13 DIAGNOSIS — M25562 Pain in left knee: Secondary | ICD-10-CM

## 2016-05-20 ENCOUNTER — Ambulatory Visit
Admission: RE | Admit: 2016-05-20 | Discharge: 2016-05-20 | Disposition: A | Discharge status: Home / Self Care | Payer: 59 | Source: Ambulatory Visit | Attending: Internal Medicine | Admitting: Internal Medicine

## 2016-05-20 DIAGNOSIS — M25562 Pain in left knee: Secondary | ICD-10-CM

## 2016-05-26 ENCOUNTER — Ambulatory Visit (HOSPITAL_COMMUNITY): Payer: 59 | Attending: Orthopedic Surgery

## 2016-05-26 DIAGNOSIS — R2681 Unsteadiness on feet: Secondary | ICD-10-CM | POA: Diagnosis present

## 2016-05-26 DIAGNOSIS — M25562 Pain in left knee: Secondary | ICD-10-CM | POA: Diagnosis present
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
## 2016-08-14 ENCOUNTER — Encounter (INDEPENDENT_AMBULATORY_CARE_PROVIDER_SITE_OTHER): Payer: Self-pay | Admitting: Internal Medicine

## 2016-08-14 ENCOUNTER — Ambulatory Visit (INDEPENDENT_AMBULATORY_CARE_PROVIDER_SITE_OTHER): Payer: 59 | Admitting: Internal Medicine

## 2016-08-14 VITALS — BP 160/62 | HR 64 | Temp 97.0°F | Ht 74.0 in | Wt 287.7 lb

## 2016-08-14 DIAGNOSIS — K588 Other irritable bowel syndrome: Secondary | ICD-10-CM
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
## 2016-08-14 NOTE — Patient Instructions (Signed)
OV in 1 year.  

## 2016-10-03 ENCOUNTER — Other Ambulatory Visit (INDEPENDENT_AMBULATORY_CARE_PROVIDER_SITE_OTHER): Payer: Self-pay | Admitting: Internal Medicine

## 2016-11-09 DIAGNOSIS — Z7901 Long term (current) use of anticoagulants: Secondary | ICD-10-CM | POA: Diagnosis not present

## 2016-11-20 DIAGNOSIS — B029 Zoster without complications: Secondary | ICD-10-CM | POA: Diagnosis not present

## 2016-12-07 DIAGNOSIS — Z7901 Long term (current) use of anticoagulants: Secondary | ICD-10-CM | POA: Diagnosis not present

## 2017-01-09 DIAGNOSIS — Z7901 Long term (current) use of anticoagulants: Secondary | ICD-10-CM | POA: Diagnosis not present

## 2017-01-10 DIAGNOSIS — E039 Hypothyroidism, unspecified: Secondary | ICD-10-CM | POA: Diagnosis not present

## 2017-01-10 DIAGNOSIS — M109 Gout, unspecified: Secondary | ICD-10-CM | POA: Diagnosis not present

## 2017-01-10 DIAGNOSIS — E785 Hyperlipidemia, unspecified: Secondary | ICD-10-CM | POA: Diagnosis not present

## 2017-01-15 DIAGNOSIS — E119 Type 2 diabetes mellitus without complications: Secondary | ICD-10-CM | POA: Diagnosis not present

## 2017-01-15 DIAGNOSIS — E039 Hypothyroidism, unspecified: Secondary | ICD-10-CM | POA: Diagnosis not present

## 2017-01-15 DIAGNOSIS — Z0001 Encounter for general adult medical examination with abnormal findings: Secondary | ICD-10-CM | POA: Diagnosis not present

## 2017-02-06 DIAGNOSIS — Z7901 Long term (current) use of anticoagulants: Secondary | ICD-10-CM | POA: Diagnosis not present

## 2017-03-06 DIAGNOSIS — Z7901 Long term (current) use of anticoagulants: Secondary | ICD-10-CM | POA: Diagnosis not present

## 2017-04-02 ENCOUNTER — Other Ambulatory Visit (INDEPENDENT_AMBULATORY_CARE_PROVIDER_SITE_OTHER): Payer: Self-pay | Admitting: Internal Medicine

## 2017-04-04 DIAGNOSIS — Z7901 Long term (current) use of anticoagulants: Secondary | ICD-10-CM | POA: Diagnosis not present

## 2017-05-01 DIAGNOSIS — E119 Type 2 diabetes mellitus without complications: Secondary | ICD-10-CM | POA: Diagnosis not present

## 2017-05-08 DIAGNOSIS — Z86718 Personal history of other venous thrombosis and embolism: Secondary | ICD-10-CM | POA: Diagnosis not present

## 2017-05-08 DIAGNOSIS — E119 Type 2 diabetes mellitus without complications: Secondary | ICD-10-CM | POA: Diagnosis not present

## 2017-05-09 DIAGNOSIS — Z7901 Long term (current) use of anticoagulants: Secondary | ICD-10-CM | POA: Diagnosis not present

## 2017-06-06 DIAGNOSIS — Z7901 Long term (current) use of anticoagulants: Secondary | ICD-10-CM | POA: Diagnosis not present

## 2017-06-28 ENCOUNTER — Encounter (INDEPENDENT_AMBULATORY_CARE_PROVIDER_SITE_OTHER): Payer: Self-pay | Admitting: Internal Medicine

## 2017-06-28 ENCOUNTER — Encounter (INDEPENDENT_AMBULATORY_CARE_PROVIDER_SITE_OTHER): Payer: Self-pay

## 2017-07-04 DIAGNOSIS — Z7901 Long term (current) use of anticoagulants: Secondary | ICD-10-CM | POA: Diagnosis not present

## 2017-08-01 DIAGNOSIS — Z7901 Long term (current) use of anticoagulants: Secondary | ICD-10-CM | POA: Diagnosis not present

## 2017-08-14 ENCOUNTER — Encounter (INDEPENDENT_AMBULATORY_CARE_PROVIDER_SITE_OTHER): Payer: Self-pay | Admitting: Internal Medicine

## 2017-08-14 ENCOUNTER — Ambulatory Visit (INDEPENDENT_AMBULATORY_CARE_PROVIDER_SITE_OTHER): Payer: 59 | Admitting: Internal Medicine

## 2017-08-14 ENCOUNTER — Encounter (INDEPENDENT_AMBULATORY_CARE_PROVIDER_SITE_OTHER): Payer: Self-pay

## 2017-08-14 VITALS — BP 130/70 | HR 60 | Temp 97.9°F | Ht 74.0 in | Wt 282.0 lb

## 2017-08-14 DIAGNOSIS — K580 Irritable bowel syndrome with diarrhea: Secondary | ICD-10-CM

## 2017-08-14 NOTE — Patient Instructions (Signed)
OV in 1 year.Continue the Dicyclomine.
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
## 2017-08-17 ENCOUNTER — Other Ambulatory Visit (INDEPENDENT_AMBULATORY_CARE_PROVIDER_SITE_OTHER): Payer: Self-pay | Admitting: Internal Medicine

## 2017-08-30 DIAGNOSIS — Z7901 Long term (current) use of anticoagulants: Secondary | ICD-10-CM | POA: Diagnosis not present

## 2017-09-13 DIAGNOSIS — E119 Type 2 diabetes mellitus without complications: Secondary | ICD-10-CM | POA: Diagnosis not present

## 2017-09-18 DIAGNOSIS — E119 Type 2 diabetes mellitus without complications: Secondary | ICD-10-CM | POA: Diagnosis not present

## 2017-09-18 DIAGNOSIS — I82409 Acute embolism and thrombosis of unspecified deep veins of unspecified lower extremity: Secondary | ICD-10-CM | POA: Diagnosis not present

## 2017-10-02 DIAGNOSIS — Z7901 Long term (current) use of anticoagulants: Secondary | ICD-10-CM | POA: Diagnosis not present

## 2017-11-06 DIAGNOSIS — Z7901 Long term (current) use of anticoagulants: Secondary | ICD-10-CM | POA: Diagnosis not present

## 2017-11-28 DIAGNOSIS — G4733 Obstructive sleep apnea (adult) (pediatric): Secondary | ICD-10-CM | POA: Diagnosis not present

## 2017-12-05 DIAGNOSIS — Z7901 Long term (current) use of anticoagulants: Secondary | ICD-10-CM | POA: Diagnosis not present

## 2018-01-02 DIAGNOSIS — Z7901 Long term (current) use of anticoagulants: Secondary | ICD-10-CM | POA: Diagnosis not present

## 2018-01-24 DIAGNOSIS — Z7901 Long term (current) use of anticoagulants: Secondary | ICD-10-CM | POA: Diagnosis not present

## 2018-02-12 DIAGNOSIS — Z7901 Long term (current) use of anticoagulants: Secondary | ICD-10-CM | POA: Diagnosis not present

## 2018-02-25 ENCOUNTER — Other Ambulatory Visit (INDEPENDENT_AMBULATORY_CARE_PROVIDER_SITE_OTHER): Payer: Self-pay | Admitting: Internal Medicine

## 2018-03-01 DIAGNOSIS — E119 Type 2 diabetes mellitus without complications: Secondary | ICD-10-CM | POA: Diagnosis not present

## 2018-03-01 DIAGNOSIS — E785 Hyperlipidemia, unspecified: Secondary | ICD-10-CM | POA: Diagnosis not present

## 2018-03-08 DIAGNOSIS — E119 Type 2 diabetes mellitus without complications: Secondary | ICD-10-CM | POA: Diagnosis not present

## 2018-03-08 DIAGNOSIS — Z0001 Encounter for general adult medical examination with abnormal findings: Secondary | ICD-10-CM | POA: Diagnosis not present

## 2018-03-08 DIAGNOSIS — I82409 Acute embolism and thrombosis of unspecified deep veins of unspecified lower extremity: Secondary | ICD-10-CM | POA: Diagnosis not present

## 2018-03-18 DIAGNOSIS — Z7901 Long term (current) use of anticoagulants: Secondary | ICD-10-CM | POA: Diagnosis not present

## 2018-03-18 DIAGNOSIS — Z79899 Other long term (current) drug therapy: Secondary | ICD-10-CM | POA: Diagnosis not present

## 2018-04-16 DIAGNOSIS — Z7901 Long term (current) use of anticoagulants: Secondary | ICD-10-CM | POA: Diagnosis not present

## 2018-05-15 DIAGNOSIS — Z7901 Long term (current) use of anticoagulants: Secondary | ICD-10-CM | POA: Diagnosis not present

## 2018-06-13 DIAGNOSIS — Z7901 Long term (current) use of anticoagulants: Secondary | ICD-10-CM | POA: Diagnosis not present

## 2018-07-09 DIAGNOSIS — E039 Hypothyroidism, unspecified: Secondary | ICD-10-CM | POA: Diagnosis not present

## 2018-07-09 DIAGNOSIS — Z79899 Other long term (current) drug therapy: Secondary | ICD-10-CM | POA: Diagnosis not present

## 2018-07-09 DIAGNOSIS — E119 Type 2 diabetes mellitus without complications: Secondary | ICD-10-CM | POA: Diagnosis not present

## 2018-07-16 DIAGNOSIS — Z7901 Long term (current) use of anticoagulants: Secondary | ICD-10-CM | POA: Diagnosis not present

## 2018-07-19 DIAGNOSIS — E785 Hyperlipidemia, unspecified: Secondary | ICD-10-CM | POA: Diagnosis not present

## 2018-07-19 DIAGNOSIS — E1169 Type 2 diabetes mellitus with other specified complication: Secondary | ICD-10-CM | POA: Diagnosis not present

## 2018-07-19 DIAGNOSIS — E039 Hypothyroidism, unspecified: Secondary | ICD-10-CM | POA: Diagnosis not present

## 2018-08-13 DIAGNOSIS — Z23 Encounter for immunization: Secondary | ICD-10-CM | POA: Diagnosis not present

## 2018-08-13 DIAGNOSIS — Z7901 Long term (current) use of anticoagulants: Secondary | ICD-10-CM | POA: Diagnosis not present

## 2018-08-14 ENCOUNTER — Encounter (INDEPENDENT_AMBULATORY_CARE_PROVIDER_SITE_OTHER): Payer: Self-pay | Admitting: Internal Medicine

## 2018-08-14 ENCOUNTER — Ambulatory Visit (INDEPENDENT_AMBULATORY_CARE_PROVIDER_SITE_OTHER): Payer: 59 | Admitting: Internal Medicine

## 2018-08-14 VITALS — BP 160/80 | HR 64 | Temp 98.0°F | Ht 74.0 in | Wt 270.0 lb

## 2018-08-14 DIAGNOSIS — K580 Irritable bowel syndrome with diarrhea: Secondary | ICD-10-CM | POA: Diagnosis not present

## 2018-08-14 NOTE — Patient Instructions (Signed)
OV in 1 year.  

## 2018-08-14 NOTE — Progress Notes (Signed)
Subjective:    Patient ID: [name], male    DOB: February 03, 1956, 62 y.o.   MRN: [id number]  HPI Here today for f/u. Last seen in October of 2017. Hx of IBS-diarrhea. Doing well.  He tells me is his doing good. Having 1 a day. Stools are formed for the most part. Sometimes his Metformin causes diarrhea.  Appetite is good. Has lost from 282 to 270 which was intentional.   Colonoscopy in June of 2013 for chronic diarrhea. Prep excellent. Normal terminal ileum. Millimeter polyp ablated via cold biopsy from proximal sigmoid colon. Normal rectal mucosa and anorectal junction.  Polyp is tubular adenoma removed from sigmoid colon via cold biopsy, it was a small polyp. Patient can wait 10 years before his next colonoscopy.   Review of Systems Past Medical History:  Diagnosis Date  . Gout   . H/O blood clots   . Hyperchloremia   . Hypercholesteremia   . Hypothyroidism   . Spleen laceration   . Thyroid disorder   . Vertigo     Past Surgical History:  Procedure Laterality Date  . CARPAL TUNNEL RELEASE     bilateral  . CHOLECYSTECTOMY    . COLONOSCOPY     10 years ago  . COLONOSCOPY  04/18/2012   Procedure: COLONOSCOPY;  Surgeon: Rogene Houston, MD;  [facility];  Service: Endoscopy;  Laterality: N/A;  100  . IVC filter    . Left clavicle    . LUMBAR DISC SURGERY     L3/L4 trim  . LUMBAR DISC SURGERY     L4 / L5 ray cage inserted  . nose surgery     to remove bone spurs    No Known Allergies  Current Outpatient Medications on File Prior to Visit  Medication Sig Dispense Refill  . atorvastatin (LIPITOR) 10 MG tablet Take 10 mg by mouth at bedtime.     . dicyclomine (BENTYL) 10 MG capsule TAKE 1 CAPSULE BY MOUTH THREE TIMES A DAY BETWEEN MEALS (Patient taking differently: 2 (two) times daily after a meal. ) 90 capsule 3  . lactobacillus acidophilus (BACID) TABS tablet Take 2 tablets by mouth 3 (three) times daily.    Marland Kitchen levothyroxine (SYNTHROID) 200 MCG  tablet Take 225 mcg by mouth See admin instructions. Take with 200 mcg tablet of levothyroxine    . metFORMIN (GLUCOPHAGE) 500 MG tablet Take 500 mg by mouth.    . Probiotic Product (ALIGN PO) Take by mouth.    . Probiotic Product (ALIGN) 4 MG CAPS Take by mouth.    . warfarin (COUMADIN) 6 MG tablet Take 6 mg by mouth daily.    Marland Kitchen dicyclomine (BENTYL) 10 MG capsule Take 10 mg by mouth 2 (two) times daily.     No current facility-administered medications on file prior to visit.         Objective:   Physical Exam Blood pressure (!) 160/80, pulse 64, temperature 98 F (36.7 C), height 6\' 2"  (1.88 m), weight 270 lb (122.5 kg). Alert and oriented. Skin warm and dry. Oral mucosa is moist.   . Sclera anicteric, conjunctivae is pink. Thyroid not enlarged. No cervical lymphadenopathy. Lungs clear. Heart regular rate and rhythm.  Abdomen is soft. Bowel sounds are positive. No hepatomegaly. No abdominal masses felt. No tenderness.  No edema to lower extremities.             Assessment & Plan:  IBS-diarrhea. He will continue the Dicyclomine. He is doing  well.  OV in 1 year.

## 2018-08-26 ENCOUNTER — Other Ambulatory Visit (INDEPENDENT_AMBULATORY_CARE_PROVIDER_SITE_OTHER): Payer: Self-pay | Admitting: Internal Medicine

## 2018-09-16 DIAGNOSIS — Z7901 Long term (current) use of anticoagulants: Secondary | ICD-10-CM | POA: Diagnosis not present

## 2018-10-15 DIAGNOSIS — Z7901 Long term (current) use of anticoagulants: Secondary | ICD-10-CM | POA: Diagnosis not present

## 2018-11-12 DIAGNOSIS — Z7901 Long term (current) use of anticoagulants: Secondary | ICD-10-CM | POA: Diagnosis not present

## 2018-11-18 DIAGNOSIS — E1169 Type 2 diabetes mellitus with other specified complication: Secondary | ICD-10-CM | POA: Diagnosis not present

## 2018-11-18 DIAGNOSIS — E785 Hyperlipidemia, unspecified: Secondary | ICD-10-CM | POA: Diagnosis not present

## 2018-11-18 DIAGNOSIS — I82503 Chronic embolism and thrombosis of unspecified deep veins of lower extremity, bilateral: Secondary | ICD-10-CM | POA: Diagnosis not present

## 2018-12-10 DIAGNOSIS — Z7901 Long term (current) use of anticoagulants: Secondary | ICD-10-CM | POA: Diagnosis not present

## 2019-01-06 DIAGNOSIS — Z7901 Long term (current) use of anticoagulants: Secondary | ICD-10-CM | POA: Diagnosis not present

## 2019-02-04 DIAGNOSIS — Z7901 Long term (current) use of anticoagulants: Secondary | ICD-10-CM | POA: Diagnosis not present

## 2019-03-05 DIAGNOSIS — Z7901 Long term (current) use of anticoagulants: Secondary | ICD-10-CM | POA: Diagnosis not present

## 2019-08-13 ENCOUNTER — Other Ambulatory Visit (INDEPENDENT_AMBULATORY_CARE_PROVIDER_SITE_OTHER): Payer: Self-pay | Admitting: Internal Medicine
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
## 2019-08-18 ENCOUNTER — Ambulatory Visit (INDEPENDENT_AMBULATORY_CARE_PROVIDER_SITE_OTHER): Payer: 59 | Admitting: Nurse Practitioner

## 2019-08-18 ENCOUNTER — Encounter (INDEPENDENT_AMBULATORY_CARE_PROVIDER_SITE_OTHER): Payer: Self-pay | Admitting: Nurse Practitioner

## 2019-08-18 ENCOUNTER — Other Ambulatory Visit: Payer: Self-pay

## 2019-08-18 VITALS — BP 161/82 | HR 61 | Temp 98.0°F | Ht 74.0 in | Wt 276.5 lb

## 2019-08-18 DIAGNOSIS — Z860100 Personal history of colon polyps, unspecified: Secondary | ICD-10-CM | POA: Insufficient documentation

## 2019-08-18 DIAGNOSIS — K589 Irritable bowel syndrome without diarrhea: Secondary | ICD-10-CM

## 2019-08-18 NOTE — Patient Instructions (Signed)
1. Continue Dicyclomine 10mg  one tab by mouth twice daily as needed for IBS  2. I will communicate with Dr. Laural Golden to verify your colonoscopy due date   3. Follow up in the office in 6 months

## 2019-08-22 ENCOUNTER — Other Ambulatory Visit (INDEPENDENT_AMBULATORY_CARE_PROVIDER_SITE_OTHER): Payer: Self-pay | Admitting: Internal Medicine

## 2019-08-28 ENCOUNTER — Other Ambulatory Visit (INDEPENDENT_AMBULATORY_CARE_PROVIDER_SITE_OTHER): Payer: Self-pay | Admitting: *Deleted

## 2019-08-28 ENCOUNTER — Other Ambulatory Visit (INDEPENDENT_AMBULATORY_CARE_PROVIDER_SITE_OTHER): Payer: Self-pay | Admitting: Nurse Practitioner

## 2019-08-28 ENCOUNTER — Telehealth (INDEPENDENT_AMBULATORY_CARE_PROVIDER_SITE_OTHER): Payer: Self-pay | Admitting: Nurse Practitioner

## 2019-08-28 ENCOUNTER — Telehealth (INDEPENDENT_AMBULATORY_CARE_PROVIDER_SITE_OTHER): Payer: Self-pay | Admitting: *Deleted

## 2019-08-28 ENCOUNTER — Encounter (INDEPENDENT_AMBULATORY_CARE_PROVIDER_SITE_OTHER): Payer: Self-pay | Admitting: *Deleted

## 2019-08-28 DIAGNOSIS — Z86010 Personal history of colonic polyps: Secondary | ICD-10-CM

## 2019-08-28 MED ORDER — SUPREP BOWEL PREP KIT 17.5-3.13-1.6 GM/177ML PO SOLN: 1.0000 | Freq: Once | ORAL | 0 refills | Status: AC

## 2019-08-28 NOTE — Telephone Encounter (Signed)
TCS sch'd 10/16/19, left detailed message for patient, mailed instructions

## 2019-08-28 NOTE — Telephone Encounter (Signed)
Patient needs suprep TCS sch'd 12/17

## 2019-08-28 NOTE — Telephone Encounter (Signed)
Ann pls contact PCP's office for Warfarin instruction prior to colonoscopy, pt has hx of DVTs and Warfarin has been prescribed and followed by his PCP. thx

## 2019-08-28 NOTE — Telephone Encounter (Signed)
[name], Allen call patient later today or tomorrow to schedule a colonoscopy. Orders in [place]. I discussed scheduling a colonoscopy at the time of his office visit but I did not order until Dr.  Laural Golden reviewed and he agreed pt is due for a colonoscopy. I called the patient this am and I left a msg on his voicemail that a colonoscopy should be scheduled at this time.

## 2019-10-14 ENCOUNTER — Other Ambulatory Visit (HOSPITAL_COMMUNITY)
Admission: RE | Admit: 2019-10-14 | Discharge: 2019-10-14 | Disposition: A | Discharge status: Home / Self Care | Payer: 59 | Source: Ambulatory Visit | Attending: Internal Medicine | Admitting: Internal Medicine

## 2019-10-14 DIAGNOSIS — Z20828 Contact with and (suspected) exposure to other viral communicable diseases: Secondary | ICD-10-CM | POA: Insufficient documentation

## 2019-10-14 DIAGNOSIS — Z01812 Encounter for preprocedural laboratory examination: Secondary | ICD-10-CM | POA: Insufficient documentation

## 2019-10-14 LAB — SARS CORONAVIRUS 2 (TAT 6-24 HRS): SARS Coronavirus 2: NEGATIVE

## 2019-10-16 ENCOUNTER — Emergency Department (HOSPITAL_COMMUNITY): Payer: 59

## 2019-10-16 ENCOUNTER — Other Ambulatory Visit: Payer: Self-pay

## 2019-10-16 ENCOUNTER — Inpatient Hospital Stay (HOSPITAL_COMMUNITY)
Admission: EM | Admit: 2019-10-16 | Discharge: 2019-10-20 | DRG: 605 | Disposition: A | Discharge status: Home / Self Care | Payer: 59 | Attending: Surgery | Admitting: Surgery

## 2019-10-16 ENCOUNTER — Encounter (HOSPITAL_COMMUNITY): Payer: Self-pay | Admitting: Emergency Medicine

## 2019-10-16 ENCOUNTER — Encounter (HOSPITAL_COMMUNITY): Admission: EM | Disposition: A | Discharge status: Home / Self Care | Payer: Self-pay | Source: Home / Self Care

## 2019-10-16 ENCOUNTER — Ambulatory Visit (HOSPITAL_COMMUNITY): Admission: RE | Admit: 2019-10-16 | Payer: 59 | Source: Ambulatory Visit | Admitting: Internal Medicine

## 2019-10-16 DIAGNOSIS — Z981 Arthrodesis status: Secondary | ICD-10-CM

## 2019-10-16 DIAGNOSIS — Z79890 Hormone replacement therapy (postmenopausal): Secondary | ICD-10-CM

## 2019-10-16 DIAGNOSIS — M109 Gout, unspecified: Secondary | ICD-10-CM | POA: Diagnosis present

## 2019-10-16 DIAGNOSIS — Z86718 Personal history of other venous thrombosis and embolism: Secondary | ICD-10-CM | POA: Diagnosis not present

## 2019-10-16 DIAGNOSIS — E039 Hypothyroidism, unspecified: Secondary | ICD-10-CM | POA: Diagnosis present

## 2019-10-16 DIAGNOSIS — Z860100 Personal history of colon polyps, unspecified: Secondary | ICD-10-CM

## 2019-10-16 DIAGNOSIS — N179 Acute kidney failure, unspecified: Secondary | ICD-10-CM | POA: Diagnosis not present

## 2019-10-16 DIAGNOSIS — Y92002 Bathroom of unspecified non-institutional (private) residence single-family (private) house as the place of occurrence of the external cause: Secondary | ICD-10-CM | POA: Diagnosis not present

## 2019-10-16 DIAGNOSIS — N189 Chronic kidney disease, unspecified: Secondary | ICD-10-CM | POA: Diagnosis present

## 2019-10-16 DIAGNOSIS — Z79899 Other long term (current) drug therapy: Secondary | ICD-10-CM

## 2019-10-16 DIAGNOSIS — Z8261 Family history of arthritis: Secondary | ICD-10-CM | POA: Diagnosis not present

## 2019-10-16 DIAGNOSIS — R55 Syncope and collapse: Secondary | ICD-10-CM

## 2019-10-16 DIAGNOSIS — D62 Acute posthemorrhagic anemia: Secondary | ICD-10-CM | POA: Diagnosis present

## 2019-10-16 DIAGNOSIS — K589 Irritable bowel syndrome without diarrhea: Secondary | ICD-10-CM | POA: Diagnosis present

## 2019-10-16 DIAGNOSIS — Z8379 Family history of other diseases of the digestive system: Secondary | ICD-10-CM

## 2019-10-16 DIAGNOSIS — R58 Hemorrhage, not elsewhere classified: Secondary | ICD-10-CM | POA: Diagnosis present

## 2019-10-16 DIAGNOSIS — I454 Nonspecific intraventricular block: Secondary | ICD-10-CM | POA: Diagnosis present

## 2019-10-16 DIAGNOSIS — E860 Dehydration: Secondary | ICD-10-CM | POA: Diagnosis present

## 2019-10-16 DIAGNOSIS — Z7901 Long term (current) use of anticoagulants: Secondary | ICD-10-CM

## 2019-10-16 DIAGNOSIS — Z9049 Acquired absence of other specified parts of digestive tract: Secondary | ICD-10-CM

## 2019-10-16 DIAGNOSIS — Z83511 Family history of glaucoma: Secondary | ICD-10-CM

## 2019-10-16 DIAGNOSIS — Z20828 Contact with and (suspected) exposure to other viral communicable diseases: Secondary | ICD-10-CM | POA: Diagnosis present

## 2019-10-16 DIAGNOSIS — Z8349 Family history of other endocrine, nutritional and metabolic diseases: Secondary | ICD-10-CM

## 2019-10-16 DIAGNOSIS — S3011XA Contusion of abdominal wall, initial encounter: Secondary | ICD-10-CM | POA: Diagnosis present

## 2019-10-16 DIAGNOSIS — E876 Hypokalemia: Secondary | ICD-10-CM | POA: Diagnosis present

## 2019-10-16 DIAGNOSIS — H538 Other visual disturbances: Secondary | ICD-10-CM | POA: Diagnosis present

## 2019-10-16 DIAGNOSIS — W1811XA Fall from or off toilet without subsequent striking against object, initial encounter: Secondary | ICD-10-CM | POA: Diagnosis present

## 2019-10-16 DIAGNOSIS — E7800 Pure hypercholesterolemia, unspecified: Secondary | ICD-10-CM | POA: Diagnosis present

## 2019-10-16 DIAGNOSIS — Z8249 Family history of ischemic heart disease and other diseases of the circulatory system: Secondary | ICD-10-CM

## 2019-10-16 DIAGNOSIS — S301XXA Contusion of abdominal wall, initial encounter: Principal | ICD-10-CM | POA: Diagnosis present

## 2019-10-16 DIAGNOSIS — Z95828 Presence of other vascular implants and grafts: Secondary | ICD-10-CM | POA: Diagnosis not present

## 2019-10-16 DIAGNOSIS — Z7984 Long term (current) use of oral hypoglycemic drugs: Secondary | ICD-10-CM

## 2019-10-16 LAB — CBC
HCT: 31.6 % — ABNORMAL LOW (ref 39.0–52.0)
Hemoglobin: 10.6 g/dL — ABNORMAL LOW (ref 13.0–17.0)
MCH: 34.2 pg — ABNORMAL HIGH (ref 26.0–34.0)
MCHC: 33.5 g/dL (ref 30.0–36.0)
MCV: 101.9 fL — ABNORMAL HIGH (ref 80.0–100.0)
Platelets: 181 10*3/uL (ref 150–400)
RBC: 3.1 MIL/uL — ABNORMAL LOW (ref 4.22–5.81)
RDW: 12.9 % (ref 11.5–15.5)
WBC: 10.5 10*3/uL (ref 4.0–10.5)
nRBC: 0 % (ref 0.0–0.2)

## 2019-10-16 LAB — URINALYSIS, ROUTINE W REFLEX MICROSCOPIC
Bilirubin Urine: NEGATIVE
Glucose, UA: NEGATIVE mg/dL
Hgb urine dipstick: NEGATIVE
Ketones, ur: NEGATIVE mg/dL
Leukocytes,Ua: NEGATIVE
Nitrite: NEGATIVE
Protein, ur: NEGATIVE mg/dL
Specific Gravity, Urine: 1.018 (ref 1.005–1.030)
pH: 5 (ref 5.0–8.0)

## 2019-10-16 LAB — HEMOGLOBIN AND HEMATOCRIT, BLOOD
HCT: 27.3 % — ABNORMAL LOW (ref 39.0–52.0)
Hemoglobin: 9.2 g/dL — ABNORMAL LOW (ref 13.0–17.0)

## 2019-10-16 LAB — COMPREHENSIVE METABOLIC PANEL
ALT: 46 U/L — ABNORMAL HIGH (ref 0–44)
AST: 37 U/L (ref 15–41)
Albumin: 4 g/dL (ref 3.5–5.0)
Alkaline Phosphatase: 35 U/L — ABNORMAL LOW (ref 38–126)
Anion gap: 16 — ABNORMAL HIGH (ref 5–15)
BUN: 13 mg/dL (ref 8–23)
CO2: 22 mmol/L (ref 22–32)
Calcium: 8.3 mg/dL — ABNORMAL LOW (ref 8.9–10.3)
Chloride: 95 mmol/L — ABNORMAL LOW (ref 98–111)
Creatinine, Ser: 1.11 mg/dL (ref 0.61–1.24)
GFR calc Af Amer: >60 mL/min (ref >60)
GFR calc non Af Amer: >60 mL/min (ref >60)
Glucose, Bld: 235 mg/dL — ABNORMAL HIGH (ref 70–99)
Potassium: 3.2 mmol/L — ABNORMAL LOW (ref 3.5–5.1)
Sodium: 133 mmol/L — ABNORMAL LOW (ref 135–145)
Total Bilirubin: 1 mg/dL (ref 0.3–1.2)
Total Protein: 6.9 g/dL (ref 6.5–8.1)

## 2019-10-16 LAB — RESPIRATORY PANEL BY RT PCR (FLU A&B, COVID)
Influenza A by PCR: NEGATIVE
Influenza B by PCR: NEGATIVE
SARS Coronavirus 2 by RT PCR: NEGATIVE

## 2019-10-16 LAB — LIPASE, BLOOD: Lipase: 33 U/L (ref 11–51)

## 2019-10-16 LAB — GLUCOSE, CAPILLARY: Glucose-Capillary: 193 mg/dL — ABNORMAL HIGH (ref 70–99)

## 2019-10-16 LAB — TYPE AND SCREEN
ABO/RH(D): A POS
Antibody Screen: NEGATIVE

## 2019-10-16 LAB — PROTIME-INR
INR: 1.3 — ABNORMAL HIGH (ref 0.8–1.2)
Prothrombin Time: 16.1 seconds — ABNORMAL HIGH (ref 11.4–15.2)

## 2019-10-16 LAB — CBG MONITORING, ED: Glucose-Capillary: 208 mg/dL — ABNORMAL HIGH (ref 70–99)

## 2019-10-16 SURGERY — COLONOSCOPY
Anesthesia: Moderate Sedation

## 2019-10-16 MED ORDER — MORPHINE SULFATE (PF) 4 MG/ML IV SOLN
4.0000 mg | Freq: Once | INTRAVENOUS | Status: AC
  Administered 2019-10-16: 4 mg via INTRAVENOUS
  Filled 2019-10-16: qty 1

## 2019-10-16 MED ORDER — OXYCODONE HCL 5 MG PO TABS: 10.0000 mg | ORAL_TABLET | ORAL | Status: DC | PRN

## 2019-10-16 MED ORDER — ONDANSETRON HCL 4 MG/2ML IJ SOLN
4.0000 mg | Freq: Four times a day (QID) | INTRAMUSCULAR | Status: DC | PRN
  Administered 2019-10-18: 22:00:00 4 mg via INTRAVENOUS
  Filled 2019-10-16 (×2): qty 2

## 2019-10-16 MED ORDER — SODIUM CHLORIDE 0.9 % IV BOLUS
1000.0000 mL | Freq: Once | INTRAVENOUS | Status: AC
  Administered 2019-10-16: 15:00:00 1000 mL via INTRAVENOUS

## 2019-10-16 MED ORDER — FENTANYL CITRATE (PF) 100 MCG/2ML IJ SOLN
50.0000 ug | INTRAMUSCULAR | Status: DC | PRN
  Administered 2019-10-16 (×2): 50 ug via INTRAVENOUS
  Filled 2019-10-16 (×2): qty 2

## 2019-10-16 MED ORDER — CHLORHEXIDINE GLUCONATE CLOTH 2 % EX PADS
6.0000 | MEDICATED_PAD | Freq: Every day | CUTANEOUS | Status: DC
  Administered 2019-10-17 – 2019-10-18 (×2): 6 via TOPICAL

## 2019-10-16 MED ORDER — ACETAMINOPHEN 325 MG PO TABS
650.0000 mg | ORAL_TABLET | ORAL | Status: DC | PRN
  Administered 2019-10-17 – 2019-10-18 (×3): 650 mg via ORAL
  Filled 2019-10-16 (×3): qty 2

## 2019-10-16 MED ORDER — ONDANSETRON HCL 4 MG/2ML IJ SOLN
4.0000 mg | Freq: Once | INTRAMUSCULAR | Status: AC
  Administered 2019-10-16: 4 mg via INTRAVENOUS
  Filled 2019-10-16: qty 2

## 2019-10-16 MED ORDER — PROTAMINE SULFATE 10 MG/ML IV SOLN
60.0000 mg | Freq: Once | INTRAVENOUS | Status: AC
  Administered 2019-10-16: 60 mg via INTRAVENOUS
  Filled 2019-10-16: qty 10

## 2019-10-16 MED ORDER — ONDANSETRON HCL 4 MG/2ML IJ SOLN
4.0000 mg | Freq: Once | INTRAMUSCULAR | Status: AC | PRN
  Administered 2019-10-16: 11:00:00 4 mg via INTRAVENOUS
  Filled 2019-10-16: qty 2

## 2019-10-16 MED ORDER — LEVOTHYROXINE SODIUM 100 MCG PO TABS
200.0000 ug | ORAL_TABLET | Freq: Every day | ORAL | Status: DC
  Administered 2019-10-17 – 2019-10-20 (×4): 200 ug via ORAL
  Filled 2019-10-16 (×2): qty 1
  Filled 2019-10-16: qty 2
  Filled 2019-10-16: qty 1
  Filled 2019-10-16: qty 2

## 2019-10-16 MED ORDER — FENTANYL CITRATE (PF) 100 MCG/2ML IJ SOLN
50.0000 ug | INTRAMUSCULAR | Status: DC | PRN
  Administered 2019-10-17 (×2): 50 ug via INTRAVENOUS
  Filled 2019-10-16 (×2): qty 2

## 2019-10-16 MED ORDER — INSULIN ASPART 100 UNIT/ML ~~LOC~~ SOLN
0.0000 [IU] | Freq: Three times a day (TID) | SUBCUTANEOUS | Status: DC
  Administered 2019-10-17: 3 [IU] via SUBCUTANEOUS

## 2019-10-16 MED ORDER — POTASSIUM CHLORIDE IN NACL 20-0.9 MEQ/L-% IV SOLN
INTRAVENOUS | Status: DC
  Filled 2019-10-16: qty 1000

## 2019-10-16 MED ORDER — ONDANSETRON 4 MG PO TBDP: 4.0000 mg | ORAL_TABLET | Freq: Four times a day (QID) | ORAL | Status: DC | PRN

## 2019-10-16 MED ORDER — OXYCODONE HCL 5 MG PO TABS: 5.0000 mg | ORAL_TABLET | ORAL | Status: DC | PRN

## 2019-10-16 MED ORDER — SODIUM CHLORIDE 0.9 % IV BOLUS
1000.0000 mL | Freq: Once | INTRAVENOUS | Status: AC
  Administered 2019-10-16: 11:00:00 1000 mL via INTRAVENOUS

## 2019-10-16 MED ORDER — IOHEXOL 300 MG/ML  SOLN
100.0000 mL | Freq: Once | INTRAMUSCULAR | Status: AC | PRN
  Administered 2019-10-16: 100 mL via INTRAVENOUS

## 2019-10-16 MED ORDER — POTASSIUM CHLORIDE 10 MEQ/100ML IV SOLN
10.0000 meq | Freq: Once | INTRAVENOUS | Status: AC
  Administered 2019-10-16: 12:00:00 10 meq via INTRAVENOUS
  Filled 2019-10-16: qty 100

## 2019-10-16 NOTE — ED Provider Notes (Signed)
CRITICAL CARE Performed by: Milton Ferguson Total critical care time: 35 minutes Critical care time was exclusive of separately billable procedures and treating other patients. Critical care was necessary to treat or prevent imminent or life-threatening deterioration. Critical care was time spent personally by me on the following activities: development of treatment plan with patient and/or surrogate as well as nursing, discussions with consultants, evaluation of patient's response to treatment, examination of patient, obtaining history from patient or surrogate, ordering and performing treatments and interventions, ordering and review of laboratory studies, ordering and review of radiographic studies, pulse oximetry and re-evaluation of patient's condition. Patient with a large abdominal wall hematoma after a fall while he was on Lovenox.  I have spoke with the trauma surgeon Dr. Georganna Skeans and he accepted the patient in transfer.  The patient will be given protamine IV to try to reverse the Lovenox   Milton Ferguson, MD 10/16/19 1720

## 2019-10-16 NOTE — ED Triage Notes (Signed)
Patient has been prepping for colonoscopy last 2 days, woke this am with nausea and vomiting, had two syncopal episode this am, one witness by wife.

## 2019-10-16 NOTE — ED Provider Notes (Signed)
[facility] Provider Note   CSN: [id number] Arrival date & time: 10/16/19  [id number]     History Chief Complaint  [name] presents with  . Loss of Consciousness    [name] is a 63 y.o. male with PMHx gout, hypercholesteremia, hypothyroidism, h/o blood clots with IVC filter, IBS who presents to the ED today for multiple episodes of syncope that occurred this morning.  Reports he has been prepping for his colonoscopy that was scheduled today with Dr. Melony Overly for the past 2 days.  He states he woke up this morning and went to use the restroom and still while on the toilet had a syncopal episode.  [name] does not believe he hit his head but is not sure.  Per wife she was not with him at the time but states that she heard a loud thump and came to check on him and he was lying on his side on the floor.  Wife reports [name] had a additional syncopal episode approximately 3 hours later again while on the toilet after having a bowel movement.  She had called Dr. Rosina Lowenstein office after the first episode of syncope and they were planning on checking some blood work prior to his colonoscopy but after the second syncopal episode she became concerned and brought [name] to the ED instead.  [name] reports he has been feeling lightheaded for the past day and does not think he drink much water.  He denies any chest pain or shortness of breath prior to the syncopal episodes or currently.  He states his abdomen is sore mostly from the Lovenox injections he has been doing.  [name] has been off of his Coumadin and has transitioned to Lovenox with plan for colonoscopy today.  he does report nausea and one episode of emesis after second syncopal episode.  No blood in emesis.  He is denying any fever, chills, chest pain, shortness of breath, room spinning dizziness, headache, unilateral weakness or numbness, any other associated symptoms.      Past Medical History:  Diagnosis Date  . Gout     . H/O blood clots   . Hyperchloremia   . Hypercholesteremia   . Hypothyroidism   . Spleen laceration   . Thyroid disorder   . Vertigo     [name] Active Problem List   Diagnosis Date Noted  . Personal history of colonic polyps 08/18/2019  . Obesity 08/01/2013  . IBS (irritable bowel syndrome) 10/14/2012  . Diarrhea 03/26/2012  . Hypothyroidism 03/26/2012  . Deep vein thrombosis (DVT), right 03/26/2012    Past Surgical History:  Procedure Laterality Date  . CARPAL TUNNEL RELEASE     bilateral  . CHOLECYSTECTOMY    . COLONOSCOPY     10 years ago  . COLONOSCOPY  04/18/2012   Procedure: COLONOSCOPY;  Surgeon: Rogene Houston, MD;  [facility]: [facility];  Service: Endoscopy;  Laterality: N/A;  100  . IVC filter    . Left clavicle    . LUMBAR DISC SURGERY     L3/L4 trim  . LUMBAR DISC SURGERY     L4 / L5 ray cage inserted  . nose surgery     to remove bone spurs       Family History  Problem Relation Age of Onset  . Hypothyroidism Mother   . Hyperlipidemia Mother   . Arthritis Mother   . Glaucoma Mother   . Heart disease Father   . Hypothyroidism Sister   . Heart  disease Brother   . Hyperlipidemia Brother   . Ulcerative colitis Brother   . Healthy Sister   . Colon cancer Neg Hx     Social History   Tobacco Use  . Smoking status: Never Smoker  . Smokeless tobacco: Never Used  Substance Use Topics  . Alcohol use: Yes    Comment: once per month  . Drug use: No    Home Medications Prior to Admission medications   Medication Sig Start Date End Date Taking? Authorizing Provider  atorvastatin (LIPITOR) 10 MG tablet Take 10 mg by mouth at bedtime.    Yes [provider]  colchicine 0.6 MG tablet Take 0.6 mg by mouth daily as needed (Gout).   Yes [provider]  dicyclomine (BENTYL) 10 MG capsule TAKE 1 CAPSULE BY MOUTH THREE TIMES A DAY BETWEEN MEALS [name] taking differently: Take 10 mg by mouth 2 (two) times daily.  08/13/19  Yes  Rehman, Mechele Dawley, MD  dicyclomine (BENTYL) 10 MG capsule TAKE 1 CAPSULE BY MOUTH THREE TIMES A DAY BETWEEN MEALS [name] taking differently: Take 10 mg by mouth 2 (two) times daily.  08/22/19  Yes Rehman, Mechele Dawley, MD  enoxaparin (LOVENOX) 120 MG/0.8ML injection Inject 120 mg into the skin every 12 (twelve) hours.   Yes [provider]  levothyroxine (SYNTHROID) 200 MCG tablet Take 200 mcg by mouth daily. Take with 200 mcg tablet of levothyroxine   Yes [provider]  metFORMIN (GLUMETZA) 1000 MG (MOD) 24 hr tablet Take 1,000 mg by mouth daily with breakfast.   Yes [provider]  Probiotic Product (ALIGN) 4 MG CAPS Take 4 mg by mouth daily.    Yes [provider]  dicyclomine (BENTYL) 10 MG capsule Take 10 mg by mouth 2 (two) times daily. 03/26/12 03/26/13  RehmanMechele Dawley, MD  dicyclomine (BENTYL) 10 MG capsule TAKE 1 CAPSULE BY MOUTH THREE TIMES A DAY BETWEEN MEALS [name] not taking: No sig reported 08/26/18   Butch Penny, NP  warfarin (COUMADIN) 6 MG tablet Take 6 mg by mouth daily.    [provider]    Allergies    [name] has no known allergies.  Review of Systems   Review of Systems  Constitutional: Negative for chills and fever.  HENT: Negative for congestion.   Eyes: Positive for visual disturbance (blurry vision, resolved).  Respiratory: Negative for shortness of breath.   Cardiovascular: Negative for chest pain.  Gastrointestinal: Positive for diarrhea, nausea and vomiting.  Genitourinary: Negative for difficulty urinating.  Musculoskeletal: Negative for myalgias.  Skin: Negative for rash.  Neurological: Positive for syncope and light-headedness. Negative for speech difficulty, weakness, numbness and headaches.    Physical Exam Updated Vital Signs BP 103/71 (BP Location: Left Arm)   Pulse 93   Temp 97.7 F (36.5 C) (Oral)   Resp 15   Ht 6\' 2"  (1.88 m)   Wt 124.7 kg   SpO2 95%   BMI 35.31 kg/m   Physical  Exam Vitals and nursing note reviewed.  Constitutional:      Appearance: He is not ill-appearing or diaphoretic.  HENT:     Head: Normocephalic and atraumatic.     Comments: No raccoon's sign or battle's sign.     Mouth/Throat:     Mouth: Mucous membranes are dry.  Eyes:     Extraocular Movements: Extraocular movements intact.     Conjunctiva/sclera: Conjunctivae normal.     Pupils: Pupils are equal, round, and reactive to light.  Cardiovascular:     Rate and Rhythm: Normal rate and regular rhythm.     Pulses: Normal pulses.  Pulmonary:     Effort: Pulmonary effort is normal.     Breath sounds: Normal breath sounds. No wheezing, rhonchi or rales.  Abdominal:     Palpations: Abdomen is soft.     Tenderness: There is abdominal tenderness. There is guarding (voluntary). There is no right CVA tenderness, left CVA tenderness or rebound.  Musculoskeletal:     Cervical back: Neck supple.  Skin:    General: Skin is warm and dry.     Coloration: Skin is pale.  Neurological:     Mental Status: He is alert.     Comments: CN 3-12 grossly intact A&O x4 GCS 15 Sensation and strength intact Coordination with finger-to-nose WNL Neg pronator drift     ED Results / Procedures / Treatments   Labs (all labs ordered are listed, but only abnormal results are displayed) Labs Reviewed  COMPREHENSIVE METABOLIC PANEL - Abnormal; Notable for the following components:      Result Value   Sodium 133 (*)    Potassium 3.2 (*)    Chloride 95 (*)    Glucose, Bld 235 (*)    Calcium 8.3 (*)    ALT 46 (*)    Alkaline Phosphatase 35 (*)    Anion gap 16 (*)    All other components within normal limits  CBC - Abnormal; Notable for the following components:   RBC 3.10 (*)    Hemoglobin 10.6 (*)    HCT 31.6 (*)    MCV 101.9 (*)    MCH 34.2 (*)    All other components within normal limits  URINALYSIS, ROUTINE W REFLEX MICROSCOPIC - Abnormal; Notable for the following components:   APPearance HAZY  (*)    All other components within normal limits  CBG MONITORING, ED - Abnormal; Notable for the following components:   Glucose-Capillary 208 (*)    All other components within normal limits  LIPASE, BLOOD    EKG EKG Interpretation  Date/Time:  Thursday October 16 2019 10:12:38 EST Ventricular Rate:  91 PR Interval:    QRS Duration: 118 QT Interval:  402 QTC Calculation: 495 R Axis:   -26 Text Interpretation: Sinus rhythm Nonspecific intraventricular conduction delay No STEMI Confirmed by Nanda Quinton [phone]) on 10/16/2019 10:36:10 AM   Radiology CT Head Wo Contrast  Result Date: 10/16/2019 CLINICAL DATA:  Syncopal episode EXAM: CT HEAD WITHOUT CONTRAST TECHNIQUE: Contiguous axial images were obtained from the base of the skull through the vertex without intravenous contrast. COMPARISON:  None. FINDINGS: Brain: There is no acute intracranial hemorrhage, mass-effect, or edema. Gray-white differentiation is preserved. There is no extra-axial fluid collection. Ventricles and sulci are within normal limits in size and configuration. Vascular: No hyperdense vessel or unexpected calcification. Skull: Calvarium is unremarkable. Sinuses/Orbits: Trace paranasal sinus mucosal thickening. Orbits are unremarkable. Other: None. IMPRESSION: No acute intracranial abnormality. Electronically Signed   By: Macy Mis M.D.   On: 10/16/2019 11:52    Procedures Procedures (including critical care time)  Medications Ordered in ED Medications  ondansetron (ZOFRAN) injection 4 mg (4 mg Intravenous Given 10/16/19 1035)  sodium chloride 0.9 % bolus 1,000 mL (0 mLs Intravenous Stopped 10/16/19 1304)  potassium chloride 10 mEq in 100 mL IVPB (0 mEq Intravenous Stopped 10/16/19 1245)  sodium chloride 0.9 % bolus 1,000 mL (1,000 mLs Intravenous New Bag/Given 10/16/19 1453)  morphine 4 MG/ML injection 4 mg (  4 mg Intravenous Given 10/16/19 1454)  ondansetron (ZOFRAN) injection 4 mg (4 mg Intravenous  Given 10/16/19 1448)  iohexol (OMNIPAQUE) 300 MG/ML solution 100 mL (100 mLs Intravenous Contrast Given 10/16/19 1606)    ED Course  I have reviewed the triage vital signs and the nursing notes.  Pertinent labs & imaging results that were available during my care of the [name] were reviewed by me and considered in my medical decision making (see chart for details).  63 year old male who presents the ED today complaining of 2 syncopal episodes that occurred this morning.  Currently has been prepping for his colonoscopy for the last 2 days.  Has had associated nausea and one episode of vomiting as well today.  On arrival [name] does appear pale and dry.  His vitals are stable.  He is afebrile without tachycardia or tachypnea.  His blood pressure is mildly soft at 103/71 on arrival.  Will check baseline lab work and give IV fluids for comfort.  Nausea medicine given.  Unsure if he hit his head and is currently anticoagulated.  Will obtain CT head at this time.  [name] without any complaints of chest pain or shortness of breath prior to syncopal episodes.  He does endorse some abdominal pain prior to prepping related to receiving Lovenox injections in his abdomen.  He has no significant tenderness on exam today.   EKG without ischemic changes.  CBC without leukocytosis.  Hemoglobin stable from previous.  CMP with decreased potassium 3.2, will replete.  Sodium and chloride mildly decreased as well, currently receiving fluids.  No other electrolyte abnormalities today. Creatinine stable from baseline as well.  Lipase is negative.  Urinalysis without infection.   On reevaluation [name] resting comfortably in bed requesting some ice chips.  Will oblige with a few ice chips at this time and reevaluate with fluid challenge.  Dr. Melony Overly has checked in to see if [name] would like colonoscopy today.  Currently still awaiting orthostatics.   [name] unable to tolerate orthostatics as when nursing staff  attempted to have [name] stand up he became very pale and states that he felt too weak to stand.  Upon reevaluation [name] is complaining of a significant amount of abdominal pain which was not present upon his arrival.  Will obtain CT abdomen and pelvis at this time.   Clinical Course as of Oct 15 1608  Thu Oct 16, 2019  1135 Potassium(!): 3.2 [MV]    Clinical Course User Index [MV] Eustaquio Maize, PA-C   At shift change case signed out to Suella Broad, PA-C, who will reassess [name] after CT scan. If no acute findings and pt continued to be unable to stand due to generalized weakness may require admission.   MDM Rules/Calculators/A&P                       Final Clinical Impression(s) / ED Diagnoses Final diagnoses:  Syncope, unspecified syncope type  Dehydration  Hypokalemia    Rx / DC Orders ED Discharge Orders    None       Eustaquio Maize, PA-C 10/16/19 1640    Long, Wonda Olds, MD 10/16/19 [phone]

## 2019-10-16 NOTE — ED Provider Notes (Signed)
63 year old male with possible syncopal episode.  Patient has been completing colonoscopy prep for the past 2 days, woke up with nausea and vomiting concern for possible head injury CT was negative, CT head negative. Patient was given 1LNS, unable to stand due to weakness during orthostatics.  Seen by Dr. Ananias Pilgrim while in the ER when he missed his colonoscopy- found to have abdominal pain and ordered CT abdomen.  If CT negative and improves after additional fluids, may dc, if not improving, may need admission for further monitoring.  On Lovenox due to Coumadin hold for colonoscopy, last had Lovenox this morning. Physical Exam  BP (!) 105/53   Pulse 99   Temp 97.7 F (36.5 C) (Oral)   Resp 15   Ht 6\' 2"  (1.88 m)   Wt 124.7 kg   SpO2 98%   BMI 35.31 kg/m   Physical Exam  ED Course/Procedures   Clinical Course as of Oct 15 1904  Thu Oct 16, 2019  1135 Potassium(!): 3.2 [MV]    Clinical Course User Index [MV] Eustaquio Maize, PA-C    Procedures  MDM  Call from Dr. Melony Overly, CT abdomen pelvis has completed, patient has a large abdominal wall hematoma. Discussed with Dr. Roderic Palau, ER attending, has consulted trauma surgery, will order protamine per pharmacy recommendation at half the lovenox dose (120mg  lovenox, 60mg  protamine). Abdominal binder ordered. Repeat H&H without significant changes from initial. Patient awaiting transfer to [facility].       Tacy Learn, PA-C 10/16/19 Manon Hilding, MD 10/16/19 [phone]

## 2019-10-16 NOTE — ED Notes (Signed)
Abdominal Binder Applied to Pt per MD request.

## 2019-10-16 NOTE — OR Nursing (Signed)
Patients wife called at 0645 this am and stated that patient had passed out in the bathroom while getting dressed. Stated patient had diabetes and was on lovenox. Wife stated batteries were dead in glucose meter but felt his sugar was ok. Informed wife that patient could have clear liquids until 2 hours prior to arrival. Move patient's arrival time up to 1030. Dr. Laural Golden notified and said to obtain a CBC and BMP on arrival. Patient's wife called back at 0920 and spoke with Abbie Sons, RN and said patient had passed out again and was going to the ED. Dr. Laural Golden notified and agreed with plan. Patient's procedure on hold for now.

## 2019-10-16 NOTE — ED Notes (Signed)
Patient attempted to stand - became very grey in color after sitting on side of bed for a few minutes - then patient stated He was getting dizzy sitting on side of bed.  Patient assisted back into bed.

## 2019-10-16 NOTE — H&P (Signed)
[name] is an 63 y.o. male.   Chief Complaint: fall, abdominal wall pain HPI: 63 year old male with a history of hypercholesterolemia and hypothyroidism also has a history of DVT Coumadin.  He was transitioned to Lovenox in preparation for a colonoscopy by Dr. Laural Golden.  He was during his colonoscopy prep when he fell in the bathroom.  He was evaluated at the emergency department at [facility] for possible syncope.  He was found to have a left-sided abdominal wall hematoma and I was asked to accept him in transfer for admission to the trauma service.  He complains of localized abdominal wall pain as well as nausea.  He does remember a little bit of the fall but felt he struck his right side more than his left.  Currently he complains of some discomfort in his left abdominal wall as well as nausea.  A binder was applied at [facility].  Past Medical History:  Diagnosis Date  . Gout   . H/O blood clots   . Hyperchloremia   . Hypercholesteremia   . Hypothyroidism   . Spleen laceration   . Thyroid disorder   . Vertigo     Past Surgical History:  Procedure Laterality Date  . CARPAL TUNNEL RELEASE     bilateral  . CHOLECYSTECTOMY    . COLONOSCOPY     10 years ago  . COLONOSCOPY  04/18/2012   Procedure: COLONOSCOPY;  Surgeon: Rogene Houston, MD;  Location: [facility] ENDO [facility];  Service: Endoscopy;  Laterality: N/A;  100  . IVC filter    . Left clavicle    . LUMBAR DISC SURGERY     L3/L4 trim  . LUMBAR DISC SURGERY     L4 / L5 ray cage inserted  . nose surgery     to remove bone spurs    Family History  Problem Relation Age of Onset  . Hypothyroidism Mother   . Hyperlipidemia Mother   . Arthritis Mother   . Glaucoma Mother   . Heart disease Father   . Hypothyroidism Sister   . Heart disease Brother   . Hyperlipidemia Brother   . Ulcerative colitis Brother   . Healthy Sister   . Colon cancer Neg Hx    Social History:  reports that he has never smoked. He has never used  smokeless tobacco. He reports current alcohol use. He reports that he does not use drugs.  Allergies: No Known Allergies  Medications Prior to Admission  Medication Sig Dispense Refill  . atorvastatin (LIPITOR) 10 MG tablet Take 10 mg by mouth at bedtime.     . colchicine 0.6 MG tablet Take 0.6 mg by mouth daily as needed (Gout).    Marland Kitchen dicyclomine (BENTYL) 10 MG capsule TAKE 1 CAPSULE BY MOUTH THREE TIMES A DAY BETWEEN MEALS (Patient taking differently: Take 10 mg by mouth 2 (two) times daily. ) 90 capsule 0  . dicyclomine (BENTYL) 10 MG capsule TAKE 1 CAPSULE BY MOUTH THREE TIMES A DAY BETWEEN MEALS (Patient taking differently: Take 10 mg by mouth 2 (two) times daily. ) 90 capsule 3  . enoxaparin (LOVENOX) 120 MG/0.8ML injection Inject 120 mg into the skin every 12 (twelve) hours.    Marland Kitchen levothyroxine (SYNTHROID) 200 MCG tablet Take 200 mcg by mouth daily. Take with 200 mcg tablet of levothyroxine    . metFORMIN (GLUMETZA) 1000 MG (MOD) 24 hr tablet Take 1,000 mg by mouth daily with breakfast.    . Probiotic Product (ALIGN) 4 MG  CAPS Take 4 mg by mouth daily.     Marland Kitchen dicyclomine (BENTYL) 10 MG capsule Take 10 mg by mouth 2 (two) times daily.    Marland Kitchen dicyclomine (BENTYL) 10 MG capsule TAKE 1 CAPSULE BY MOUTH THREE TIMES A DAY BETWEEN MEALS (Patient not taking: No sig reported) 90 capsule 3  . warfarin (COUMADIN) 6 MG tablet Take 6 mg by mouth daily.      Results for orders placed or performed during the hospital encounter of 10/16/19 (from the past 48 hour(s))  CBG monitoring, ED     Status: Abnormal   Collection Time: 10/16/19 10:10 AM  Result Value Ref Range   Glucose-Capillary 208 (H) 70 - 99 mg/dL  Lipase, blood     Status: None   Collection Time: 10/16/19 10:24 AM  Result Value Ref Range   Lipase 33 11 - 51 U/L    Comment: Performed at [facility], [address], [place], [place] [address]  Comprehensive metabolic panel     Status: Abnormal   Collection Time: 10/16/19 10:24 AM  Result  Value Ref Range   Sodium 133 (L) 135 - 145 mmol/L   Potassium 3.2 (L) 3.5 - 5.1 mmol/L   Chloride 95 (L) 98 - 111 mmol/L   CO2 22 22 - 32 mmol/L   Glucose, Bld 235 (H) 70 - 99 mg/dL   BUN 13 8 - 23 mg/dL   Creatinine, Ser 1.11 0.61 - 1.24 mg/dL   Calcium 8.3 (L) 8.9 - 10.3 mg/dL   Total Protein 6.9 6.5 - 8.1 g/dL   Albumin 4.0 3.5 - 5.0 g/dL   AST 37 15 - 41 U/L   ALT 46 (H) 0 - 44 U/L   Alkaline Phosphatase 35 (L) 38 - 126 U/L   Total Bilirubin 1.0 0.3 - 1.2 mg/dL   GFR calc non Af Amer >60 >60 mL/min   GFR calc Af Amer >60 >60 mL/min   Anion gap 16 (H) 5 - 15    Comment: Performed at [facility], [address], [place], [place] [address]  CBC     Status: Abnormal   Collection Time: 10/16/19 10:24 AM  Result Value Ref Range   WBC 10.5 4.0 - 10.5 K/uL   RBC 3.10 (L) 4.22 - 5.81 MIL/uL   Hemoglobin 10.6 (L) 13.0 - 17.0 g/dL   HCT 31.6 (L) 39.0 - 52.0 %   MCV 101.9 (H) 80.0 - 100.0 fL   MCH 34.2 (H) 26.0 - 34.0 pg   MCHC 33.5 30.0 - 36.0 g/dL   RDW 12.9 11.5 - 15.5 %   Platelets 181 150 - 400 K/uL   nRBC 0.0 0.0 - 0.2 %    Comment: Performed at [facility], [address], [place], [place] [address]  Urinalysis, Routine w reflex microscopic     Status: Abnormal   Collection Time: 10/16/19  1:35 PM  Result Value Ref Range   Color, Urine YELLOW YELLOW   APPearance HAZY (A) CLEAR   Specific Gravity, Urine 1.018 1.005 - 1.030   pH 5.0 5.0 - 8.0   Glucose, UA NEGATIVE NEGATIVE mg/dL   Hgb urine dipstick NEGATIVE NEGATIVE   Bilirubin Urine NEGATIVE NEGATIVE   Ketones, ur NEGATIVE NEGATIVE mg/dL   Protein, ur NEGATIVE NEGATIVE mg/dL   Nitrite NEGATIVE NEGATIVE   Leukocytes,Ua NEGATIVE NEGATIVE    Comment: Performed at [facility], [address], [place], [place] [address]  Type and screen [facility]     Status: None  Collection Time: 10/16/19  5:21 PM  Result Value Ref Range   ABO/RH(D) A POS    Antibody Screen NEG    Sample Expiration       10/19/2019,2359 Performed at [facility], [address] [place], [place] [address]   Hemoglobin and hematocrit, blood     Status: Abnormal   Collection Time: 10/16/19  5:22 PM  Result Value Ref Range   Hemoglobin 9.2 (L) 13.0 - 17.0 g/dL   HCT 27.3 (L) 39.0 - 52.0 %    Comment: Performed at [facility], [address] [place], [place] [address]  Protime-INR     Status: Abnormal   Collection Time: 10/16/19  5:22 PM  Result Value Ref Range   Prothrombin Time 16.1 (H) 11.4 - 15.2 seconds   INR 1.3 (H) 0.8 - 1.2    Comment: (NOTE) INR goal varies based on device and disease states. Performed at [facility], [address] [place], [place] [address]   Respiratory Panel by RT PCR (Flu A&B, Covid) - Nasopharyngeal Swab     Status: None   Collection Time: 10/16/19  7:50 PM   Specimen: Nasopharyngeal Swab  Result Value Ref Range   SARS Coronavirus 2 by RT PCR NEGATIVE NEGATIVE    Comment: (NOTE) SARS-CoV-2 target nucleic acids are NOT DETECTED. The SARS-CoV-2 RNA is generally detectable in upper respiratoy specimens during the acute phase of infection. The lowest concentration of SARS-CoV-2 viral copies this assay can detect is 131 copies/mL. A negative result does not preclude SARS-Cov-2 infection and should not be used as the sole basis for treatment or other patient management decisions. A negative result may occur with  improper specimen collection/handling, submission of specimen other than nasopharyngeal swab, presence of viral mutation(s) within the areas targeted by this assay, and inadequate number of viral copies (<131 copies/mL). A negative result must be combined with clinical observations, patient history, and epidemiological information. The expected result is Negative. Fact Sheet for Patients:  PinkCheek.be Fact Sheet for Healthcare Providers:  GravelBags.it This test is not yet ap proved or cleared by the  [place] FDA and  has been authorized for detection and/or diagnosis of SARS-CoV-2 by FDA under an Emergency Use Authorization (EUA). This EUA will remain  in effect (meaning this test can be used) for the duration of the COVID-19 declaration under Section 564(b)(1) of the Act, 21 U.S.C. section 360bbb-3(b)(1), unless the authorization is terminated or revoked sooner.    Influenza A by PCR NEGATIVE NEGATIVE   Influenza B by PCR NEGATIVE NEGATIVE    Comment: (NOTE) The Xpert Xpress SARS-CoV-2/FLU/RSV assay is intended as an aid in  the diagnosis of influenza from Nasopharyngeal swab specimens and  should not be used as a sole basis for treatment. Nasal washings and  aspirates are unacceptable for Xpert Xpress SARS-CoV-2/FLU/RSV  testing. Fact Sheet for Patients: PinkCheek.be Fact Sheet for Healthcare Providers: GravelBags.it This test is not yet approved or cleared by the [place] FDA and  has been authorized for detection and/or diagnosis of SARS-CoV-2 by  FDA under an Emergency Use Authorization (EUA). This EUA will remain  in effect (meaning this test can be used) for the duration of the  Covid-19 declaration under Section 564(b)(1) of the Act, 21  U.S.C. section 360bbb-3(b)(1), unless the authorization is  terminated or revoked. Performed at [facility], [address] [place],  [address]    CT Head Wo Contrast  Result Date: 10/16/2019 CLINICAL DATA:  Syncopal episode EXAM: CT  HEAD WITHOUT CONTRAST TECHNIQUE: Contiguous axial images were obtained from the base of the skull through the vertex without intravenous contrast. COMPARISON:  None. FINDINGS: Brain: There is no acute intracranial hemorrhage, mass-effect, or edema. Gray-white differentiation is preserved. There is no extra-axial fluid collection. Ventricles and sulci are within normal limits in size and configuration. Vascular: No hyperdense vessel or  unexpected calcification. Skull: Calvarium is unremarkable. Sinuses/Orbits: Trace paranasal sinus mucosal thickening. Orbits are unremarkable. Other: None. IMPRESSION: No acute intracranial abnormality. Electronically Signed   By: Macy Mis M.D.   On: 10/16/2019 11:52   CT Abdomen Pelvis W Contrast  Result Date: 10/16/2019 CLINICAL DATA:  Left side pain started when he was taken off blood thinners and give lovenox, taken off blood thinner for colonoscopy today, became weak yesterday, hx gb surg EXAM: CT ABDOMEN AND PELVIS WITH CONTRAST TECHNIQUE: Multidetector CT imaging of the abdomen and pelvis was performed using the standard protocol following bolus administration of intravenous contrast. CONTRAST:  156mL OMNIPAQUE IOHEXOL 300 MG/ML  SOLN COMPARISON:  03/24/2007 FINDINGS: Lower chest: No acute abnormality. Hepatobiliary: No focal liver abnormality is seen. Status post cholecystectomy. No biliary dilatation. Pancreas: Unremarkable. No pancreatic ductal dilatation or surrounding inflammatory changes. Spleen: Normal in size without focal abnormality. Adrenals/Urinary Tract: Adrenal glands are unremarkable. Kidneys are normal, without renal calculi, focal lesion, or hydronephrosis. Bladder is unremarkable. Stomach/Bowel: Stomach is unremarkable. Small bowel and colon normal in caliber. No wall thickening. No inflammation. Appendix not visualized. No evidence of appendicitis. Vascular/Lymphatic: Vena cava filter, tip projecting at the level of the L3-L4 disc. Mild aortic atherosclerosis.  No aneurysm. No enlarged lymph nodes. Reproductive: Unremarkable. Other: There is a large left anterior abdominal wall hematoma. A small linear focus of arterial extravasation is seen extending from the left side of the hematoma and left anterolateral chest wall musculature. The more discrete hematoma measures 2.4 cm superior to inferior by 1.9 x 1.3 cm transversely. This contains dependent layering fluid, hematocrit  affect. Adjacent to this collection is additional hemorrhage large is the anterolateral abdominal wall from the lower chest through the pelvis. No ascites or hemoperitoneum. Musculoskeletal: Large hematoma on the left as described. This is centered on the left rectus abdominus muscle but involves the oblique and transverse abdominal musculature as well. No fracture or acute finding. No osteoblastic or osteolytic lesions. Status post interbody fusion at L4-L5. IMPRESSION: 1. Large left anterior abdominal wall hemorrhage. There is a small linear focus of arterial extravasation from the left upper margin of the hematoma. 2. No other acute abnormality within the abdomen or pelvis. Electronically Signed   By: Lajean Manes M.D.   On: 10/16/2019 16:43    Review of Systems  Constitutional: Negative.   HENT: Negative.   Eyes: Negative.   Respiratory: Negative for chest tightness and shortness of breath.   Cardiovascular: Negative for chest pain.  Gastrointestinal: Positive for abdominal distention and abdominal pain.       Prep for colonoscopy  Endocrine: Negative.   Genitourinary: Negative.   Musculoskeletal: Negative.   Allergic/Immunologic: Negative.   Neurological: Positive for light-headedness.  Hematological: Bruises/bleeds easily.  Psychiatric/Behavioral: Negative.     Blood pressure 125/74, pulse 96, temperature 97.8 F (36.6 C), temperature source Oral, resp. rate 13, height 6\' 2"  (1.88 m), weight 125.2 kg, SpO2 100 %. Physical Exam  Constitutional: He is oriented to person, place, and time. He appears well-developed and well-nourished. No distress.  HENT:  Head: Normocephalic.  Right Ear: External ear normal.  Left Ear:  External ear normal.  Nose: Nose normal.  Mouth/Throat: Oropharynx is clear and moist.  Eyes: Pupils are equal, round, and reactive to light. EOM are normal.  Neck: No tracheal deviation present. No thyromegaly present.  No posterior midline tenderness   Cardiovascular: Normal rate, regular rhythm, normal heart sounds and intact distal pulses.  Respiratory: Effort normal and breath sounds normal. No respiratory distress. He has no wheezes. He has no rales.  GI: Soft. He exhibits distension. There is abdominal tenderness. There is no rebound and no guarding.  Large fullness and right abdominal wall rectus region with mild tenderness there, no generalized tenderness, binder adjusted and in place  Musculoskeletal:        General: No deformity or edema. Normal range of motion.     Cervical back: Neck supple.  Neurological: He is alert and oriented to person, place, and time. He displays no atrophy and no tremor. No cranial nerve deficit. He exhibits normal muscle tone. He displays no seizure activity. GCS eye subscore is 4. GCS verbal subscore is 5. GCS motor subscore is 6.  Skin: Skin is warm.  Psychiatric: He has a normal mood and affect.     Assessment/Plan Fall Large L abdominal wall/rectus hematoma - received Protamine at [facility] ED. Binder, seriel CBC, bedrest tonight. Hold anticoagulation. This should tamponade. ABL anemia - due to above  Admit to inpatient, ICU Zenovia Jarred, MD 10/16/2019, 11:31 PM

## 2019-10-17 ENCOUNTER — Other Ambulatory Visit: Payer: Self-pay

## 2019-10-17 LAB — CBC
HCT: 24.7 % — ABNORMAL LOW (ref 39.0–52.0)
HCT: 21.6 % — ABNORMAL LOW (ref 39.0–52.0)
HCT: 20.7 % — ABNORMAL LOW (ref 39.0–52.0)
HCT: 22.4 % — ABNORMAL LOW (ref 39.0–52.0)
Hemoglobin: 8.4 g/dL — ABNORMAL LOW (ref 13.0–17.0)
Hemoglobin: 7.3 g/dL — ABNORMAL LOW (ref 13.0–17.0)
Hemoglobin: 7 g/dL — ABNORMAL LOW (ref 13.0–17.0)
Hemoglobin: 7.4 g/dL — ABNORMAL LOW (ref 13.0–17.0)
MCH: 34.3 pg — ABNORMAL HIGH (ref 26.0–34.0)
MCH: 34.1 pg — ABNORMAL HIGH (ref 26.0–34.0)
MCH: 34.5 pg — ABNORMAL HIGH (ref 26.0–34.0)
MCH: 32.6 pg (ref 26.0–34.0)
MCHC: 34 g/dL (ref 30.0–36.0)
MCHC: 33.8 g/dL (ref 30.0–36.0)
MCHC: 33.8 g/dL (ref 30.0–36.0)
MCHC: 33 g/dL (ref 30.0–36.0)
MCV: 100.8 fL — ABNORMAL HIGH (ref 80.0–100.0)
MCV: 100.9 fL — ABNORMAL HIGH (ref 80.0–100.0)
MCV: 102 fL — ABNORMAL HIGH (ref 80.0–100.0)
MCV: 98.7 fL (ref 80.0–100.0)
Platelets: 187 10*3/uL (ref 150–400)
Platelets: 185 10*3/uL (ref 150–400)
Platelets: 160 10*3/uL (ref 150–400)
Platelets: 161 10*3/uL (ref 150–400)
RBC: 2.45 MIL/uL — ABNORMAL LOW (ref 4.22–5.81)
RBC: 2.14 MIL/uL — ABNORMAL LOW (ref 4.22–5.81)
RBC: 2.03 MIL/uL — ABNORMAL LOW (ref 4.22–5.81)
RBC: 2.27 MIL/uL — ABNORMAL LOW (ref 4.22–5.81)
RDW: 13 % (ref 11.5–15.5)
RDW: 13.2 % (ref 11.5–15.5)
RDW: 13.2 % (ref 11.5–15.5)
RDW: 15 % (ref 11.5–15.5)
WBC: 11.3 10*3/uL — ABNORMAL HIGH (ref 4.0–10.5)
WBC: 10.5 10*3/uL (ref 4.0–10.5)
WBC: 11 10*3/uL — ABNORMAL HIGH (ref 4.0–10.5)
WBC: 11.7 10*3/uL — ABNORMAL HIGH (ref 4.0–10.5)
nRBC: 0 % (ref 0.0–0.2)
nRBC: 0.2 % (ref 0.0–0.2)
nRBC: 0.5 % — ABNORMAL HIGH (ref 0.0–0.2)
nRBC: 1 % — ABNORMAL HIGH (ref 0.0–0.2)

## 2019-10-17 LAB — BASIC METABOLIC PANEL
Anion gap: 14 (ref 5–15)
Anion gap: 14 (ref 5–15)
BUN: 21 mg/dL (ref 8–23)
BUN: 26 mg/dL — ABNORMAL HIGH (ref 8–23)
CO2: 20 mmol/L — ABNORMAL LOW (ref 22–32)
CO2: 18 mmol/L — ABNORMAL LOW (ref 22–32)
Calcium: 7.8 mg/dL — ABNORMAL LOW (ref 8.9–10.3)
Calcium: 7.8 mg/dL — ABNORMAL LOW (ref 8.9–10.3)
Chloride: 99 mmol/L (ref 98–111)
Chloride: 100 mmol/L (ref 98–111)
Creatinine, Ser: 2.29 mg/dL — ABNORMAL HIGH (ref 0.61–1.24)
Creatinine, Ser: 2.9 mg/dL — ABNORMAL HIGH (ref 0.61–1.24)
GFR calc Af Amer: 34 mL/min — ABNORMAL LOW (ref >60)
GFR calc Af Amer: 25 mL/min — ABNORMAL LOW (ref >60)
GFR calc non Af Amer: 29 mL/min — ABNORMAL LOW (ref >60)
GFR calc non Af Amer: 22 mL/min — ABNORMAL LOW (ref >60)
Glucose, Bld: 192 mg/dL — ABNORMAL HIGH (ref 70–99)
Glucose, Bld: 195 mg/dL — ABNORMAL HIGH (ref 70–99)
Potassium: 4.4 mmol/L (ref 3.5–5.1)
Potassium: 4.4 mmol/L (ref 3.5–5.1)
Sodium: 133 mmol/L — ABNORMAL LOW (ref 135–145)
Sodium: 132 mmol/L — ABNORMAL LOW (ref 135–145)

## 2019-10-17 LAB — GLUCOSE, CAPILLARY
Glucose-Capillary: 163 mg/dL — ABNORMAL HIGH (ref 70–99)
Glucose-Capillary: 148 mg/dL — ABNORMAL HIGH (ref 70–99)
Glucose-Capillary: 168 mg/dL — ABNORMAL HIGH (ref 70–99)
Glucose-Capillary: 161 mg/dL — ABNORMAL HIGH (ref 70–99)

## 2019-10-17 LAB — PREPARE RBC (CROSSMATCH)

## 2019-10-17 LAB — MRSA PCR SCREENING: MRSA by PCR: NEGATIVE

## 2019-10-17 LAB — CREATININE, URINE, RANDOM: Creatinine, Urine: 298.55 mg/dL

## 2019-10-17 LAB — HEMOGLOBIN A1C
Hgb A1c MFr Bld: 6.8 % — ABNORMAL HIGH (ref 4.8–5.6)
Mean Plasma Glucose: 148.46 mg/dL

## 2019-10-17 LAB — SODIUM, URINE, RANDOM: Sodium, Ur: <10 mmol/L

## 2019-10-17 LAB — HIV ANTIBODY (ROUTINE TESTING W REFLEX): HIV Screen 4th Generation wRfx: NONREACTIVE

## 2019-10-17 MED ORDER — LACTATED RINGERS IV BOLUS
1000.0000 mL | Freq: Once | INTRAVENOUS | Status: AC
  Administered 2019-10-17: 1000 mL via INTRAVENOUS

## 2019-10-17 MED ORDER — MORPHINE SULFATE (PF) 2 MG/ML IV SOLN: 2.0000 mg | INTRAVENOUS | Status: DC | PRN

## 2019-10-17 MED ORDER — SODIUM CHLORIDE 0.9% IV SOLUTION: Freq: Once | INTRAVENOUS | Status: DC

## 2019-10-17 MED ORDER — ALUM & MAG HYDROXIDE-SIMETH 200-200-20 MG/5ML PO SUSP
30.0000 mL | ORAL | Status: DC | PRN
  Administered 2019-10-17 – 2019-10-19 (×5): 30 mL via ORAL
  Filled 2019-10-17 (×6): qty 30

## 2019-10-17 MED ORDER — INSULIN ASPART 100 UNIT/ML ~~LOC~~ SOLN
0.0000 [IU] | Freq: Three times a day (TID) | SUBCUTANEOUS | Status: DC
  Administered 2019-10-17: 12:00:00 3 [IU] via SUBCUTANEOUS
  Administered 2019-10-17 – 2019-10-18 (×3): 4 [IU] via SUBCUTANEOUS
  Administered 2019-10-18 – 2019-10-19 (×2): 3 [IU] via SUBCUTANEOUS
  Administered 2019-10-19 (×2): 4 [IU] via SUBCUTANEOUS
  Administered 2019-10-20 (×2): 3 [IU] via SUBCUTANEOUS

## 2019-10-17 NOTE — Progress Notes (Signed)
Trauma Critical Care Follow Up Note  Subjective:    Overnight Issues: NAEON. Reports improved pain.   Objective:  Vital signs for last 24 hours: Temp:  [97.7 F (36.5 C)-98.4 F (36.9 C)] 98.3 F (36.8 C) (12/18 0700) Pulse Rate:  [78-99] 95 (12/18 0800) Resp:  [11-20] 16 (12/18 0800) BP: (94-140)/(49-75) 111/67 (12/18 0800) SpO2:  [95 %-100 %] 100 % (12/18 0800) Weight:  [124.7 kg-125.2 kg] 125.2 kg (12/17 2245)  Hemodynamic parameters for last 24 hours:    Intake/Output from previous day: 12/17 0701 - 12/18 0700 In: 2427.6 [I.V.:328.4; IV Piggyback:2099.2] Out: 200 [Urine:200]  Intake/Output this shift: Total I/O In: 50 [I.V.:50] Out: -   Vent settings for last 24 hours:    Physical Exam:  Gen: comfortable, no distress Neuro: non-focal exam HEENT: PERRL Neck: supple CV: RRR Pulm: unlabored breathing Abd: soft, L sided abdominal wall mass, appropriately TTP GU: spont voids Extr: wwp, no edema   Results for orders placed or performed during the hospital encounter of 10/16/19 (from the past 24 hour(s))  CBG monitoring, ED     Status: Abnormal   Collection Time: 10/16/19 10:10 AM  Result Value Ref Range   Glucose-Capillary 208 (H) 70 - 99 mg/dL  Lipase, blood     Status: None   Collection Time: 10/16/19 10:24 AM  Result Value Ref Range   Lipase 33 11 - 51 U/L  Comprehensive metabolic panel     Status: Abnormal   Collection Time: 10/16/19 10:24 AM  Result Value Ref Range   Sodium 133 (L) 135 - 145 mmol/L   Potassium 3.2 (L) 3.5 - 5.1 mmol/L   Chloride 95 (L) 98 - 111 mmol/L   CO2 22 22 - 32 mmol/L   Glucose, Bld 235 (H) 70 - 99 mg/dL   BUN 13 8 - 23 mg/dL   Creatinine, Ser 1.11 0.61 - 1.24 mg/dL   Calcium 8.3 (L) 8.9 - 10.3 mg/dL   Total Protein 6.9 6.5 - 8.1 g/dL   Albumin 4.0 3.5 - 5.0 g/dL   AST 37 15 - 41 U/L   ALT 46 (H) 0 - 44 U/L   Alkaline Phosphatase 35 (L) 38 - 126 U/L   Total Bilirubin 1.0 0.3 - 1.2 mg/dL   GFR calc non Af Amer >60  >60 mL/min   GFR calc Af Amer >60 >60 mL/min   Anion gap 16 (H) 5 - 15  CBC     Status: Abnormal   Collection Time: 10/16/19 10:24 AM  Result Value Ref Range   WBC 10.5 4.0 - 10.5 K/uL   RBC 3.10 (L) 4.22 - 5.81 MIL/uL   Hemoglobin 10.6 (L) 13.0 - 17.0 g/dL   HCT 31.6 (L) 39.0 - 52.0 %   MCV 101.9 (H) 80.0 - 100.0 fL   MCH 34.2 (H) 26.0 - 34.0 pg   MCHC 33.5 30.0 - 36.0 g/dL   RDW 12.9 11.5 - 15.5 %   Platelets 181 150 - 400 K/uL   nRBC 0.0 0.0 - 0.2 %  Urinalysis, Routine w reflex microscopic     Status: Abnormal   Collection Time: 10/16/19  1:35 PM  Result Value Ref Range   Color, Urine YELLOW YELLOW   APPearance HAZY (A) CLEAR   Specific Gravity, Urine 1.018 1.005 - 1.030   pH 5.0 5.0 - 8.0   Glucose, UA NEGATIVE NEGATIVE mg/dL   Hgb urine dipstick NEGATIVE NEGATIVE   Bilirubin Urine NEGATIVE NEGATIVE   Ketones, ur NEGATIVE  NEGATIVE mg/dL   Protein, ur NEGATIVE NEGATIVE mg/dL   Nitrite NEGATIVE NEGATIVE   Leukocytes,Ua NEGATIVE NEGATIVE  Type and screen [facility]     Status: None   Collection Time: 10/16/19  5:21 PM  Result Value Ref Range   ABO/RH(D) A POS    Antibody Screen NEG    Sample Expiration      10/19/2019,2359 Performed at [facility], [address], [place], [place] [address]   Hemoglobin and hematocrit, blood     Status: Abnormal   Collection Time: 10/16/19  5:22 PM  Result Value Ref Range   Hemoglobin 9.2 (L) 13.0 - 17.0 g/dL   HCT 27.3 (L) 39.0 - 52.0 %  Protime-INR     Status: Abnormal   Collection Time: 10/16/19  5:22 PM  Result Value Ref Range   Prothrombin Time 16.1 (H) 11.4 - 15.2 seconds   INR 1.3 (H) 0.8 - 1.2  Respiratory Panel by RT PCR (Flu A&B, Covid) - Nasopharyngeal Swab     Status: None   Collection Time: 10/16/19  7:50 PM   Specimen: Nasopharyngeal Swab  Result Value Ref Range   SARS Coronavirus 2 by RT PCR NEGATIVE NEGATIVE   Influenza A by PCR NEGATIVE NEGATIVE   Influenza B by PCR NEGATIVE NEGATIVE  MRSA PCR  Screening     Status: None   Collection Time: 10/16/19 10:45 PM   Specimen: Nasopharyngeal  Result Value Ref Range   MRSA by PCR NEGATIVE NEGATIVE  Glucose, capillary     Status: Abnormal   Collection Time: 10/16/19 11:17 PM  Result Value Ref Range   Glucose-Capillary 193 (H) 70 - 99 mg/dL  HIV Antibody (routine testing w rflx)     Status: None   Collection Time: 10/17/19 12:11 AM  Result Value Ref Range   HIV Screen 4th Generation wRfx NON REACTIVE NON REACTIVE  CBC     Status: Abnormal   Collection Time: 10/17/19 12:11 AM  Result Value Ref Range   WBC 11.3 (H) 4.0 - 10.5 K/uL   RBC 2.45 (L) 4.22 - 5.81 MIL/uL   Hemoglobin 8.4 (L) 13.0 - 17.0 g/dL   HCT 24.7 (L) 39.0 - 52.0 %   MCV 100.8 (H) 80.0 - 100.0 fL   MCH 34.3 (H) 26.0 - 34.0 pg   MCHC 34.0 30.0 - 36.0 g/dL   RDW 13.0 11.5 - 15.5 %   Platelets 187 150 - 400 K/uL   nRBC 0.0 0.0 - 0.2 %  Hemoglobin A1c     Status: Abnormal   Collection Time: 10/17/19 12:11 AM  Result Value Ref Range   Hgb A1c MFr Bld 6.8 (H) 4.8 - 5.6 %   Mean Plasma Glucose 148.46 mg/dL  Type and screen [place]     Status: None   Collection Time: 10/17/19 12:18 AM  Result Value Ref Range   ABO/RH(D) A POS    Antibody Screen NEG    Sample Expiration      10/20/2019,2359 Performed at [facility], [address], [place],  [address]   Glucose, capillary     Status: Abnormal   Collection Time: 10/17/19  7:23 AM  Result Value Ref Range   Glucose-Capillary 163 (H) 70 - 99 mg/dL    Assessment & Plan: Present on Admission: . Hemorrhage . Traumatic rectus hematoma    LOS: 1 day   Additional comments:I reviewed the patient's new clinical lab test results.    66M s/p fall  Possible syncope - likely related to dehydration from colonoscopy prep. No additional intervention indicated.  Large L abdominal wall/rectus hematoma - received Protamine at [facility] ED. Binder, serial CBC. Lift bedrest orders today. Continue to  hold anticoagulation. Expectation of tamponade. ABL anemia - due to above. Continue to monitor.  FEN - advance to CM diet, continue SSI DVT - hold AC, SCDs Dispo - 4NP   Jesusita Oka, MD Trauma & General Surgery Please use [email or web address] to contact on call provider  10/17/2019  *Care during the described time interval was provided by me. I have reviewed this patient's available data, including medical history, events of note, physical examination and test results as part of my evaluation.

## 2019-10-17 NOTE — Plan of Care (Signed)
  Problem: Elimination: Goal: Will not experience complications related to urinary retention Outcome: Progressing   Problem: Safety: Goal: Ability to remain free from injury will improve Outcome: Progressing   Problem: Skin Integrity: Goal: Risk for impaired skin integrity will decrease Outcome: Progressing   

## 2019-10-18 LAB — CBC
HCT: 21.8 % — ABNORMAL LOW (ref 39.0–52.0)
HCT: 20.9 % — ABNORMAL LOW (ref 39.0–52.0)
HCT: 24.5 % — ABNORMAL LOW (ref 39.0–52.0)
Hemoglobin: 7.4 g/dL — ABNORMAL LOW (ref 13.0–17.0)
Hemoglobin: 7.2 g/dL — ABNORMAL LOW (ref 13.0–17.0)
Hemoglobin: 8.7 g/dL — ABNORMAL LOW (ref 13.0–17.0)
MCH: 33.3 pg (ref 26.0–34.0)
MCH: 33.3 pg (ref 26.0–34.0)
MCH: 33 pg (ref 26.0–34.0)
MCHC: 33.9 g/dL (ref 30.0–36.0)
MCHC: 34.4 g/dL (ref 30.0–36.0)
MCHC: 35.5 g/dL (ref 30.0–36.0)
MCV: 98.2 fL (ref 80.0–100.0)
MCV: 96.8 fL (ref 80.0–100.0)
MCV: 92.8 fL (ref 80.0–100.0)
Platelets: 169 10*3/uL (ref 150–400)
Platelets: 166 10*3/uL (ref 150–400)
Platelets: 155 10*3/uL (ref 150–400)
RBC: 2.22 MIL/uL — ABNORMAL LOW (ref 4.22–5.81)
RBC: 2.16 MIL/uL — ABNORMAL LOW (ref 4.22–5.81)
RBC: 2.64 MIL/uL — ABNORMAL LOW (ref 4.22–5.81)
RDW: 15.6 % — ABNORMAL HIGH (ref 11.5–15.5)
RDW: 15.7 % — ABNORMAL HIGH (ref 11.5–15.5)
RDW: 15.9 % — ABNORMAL HIGH (ref 11.5–15.5)
WBC: 12.3 10*3/uL — ABNORMAL HIGH (ref 4.0–10.5)
WBC: 12 10*3/uL — ABNORMAL HIGH (ref 4.0–10.5)
WBC: 10.8 10*3/uL — ABNORMAL HIGH (ref 4.0–10.5)
nRBC: 1 % — ABNORMAL HIGH (ref 0.0–0.2)
nRBC: 1.1 % — ABNORMAL HIGH (ref 0.0–0.2)
nRBC: 1.8 % — ABNORMAL HIGH (ref 0.0–0.2)

## 2019-10-18 LAB — GLUCOSE, CAPILLARY
Glucose-Capillary: 147 mg/dL — ABNORMAL HIGH (ref 70–99)
Glucose-Capillary: 160 mg/dL — ABNORMAL HIGH (ref 70–99)
Glucose-Capillary: 151 mg/dL — ABNORMAL HIGH (ref 70–99)
Glucose-Capillary: 161 mg/dL — ABNORMAL HIGH (ref 70–99)

## 2019-10-18 LAB — BASIC METABOLIC PANEL
Anion gap: 11 (ref 5–15)
BUN: 36 mg/dL — ABNORMAL HIGH (ref 8–23)
CO2: 22 mmol/L (ref 22–32)
Calcium: 8.3 mg/dL — ABNORMAL LOW (ref 8.9–10.3)
Chloride: 98 mmol/L (ref 98–111)
Creatinine, Ser: 2.46 mg/dL — ABNORMAL HIGH (ref 0.61–1.24)
GFR calc Af Amer: 31 mL/min — ABNORMAL LOW (ref >60)
GFR calc non Af Amer: 27 mL/min — ABNORMAL LOW (ref >60)
Glucose, Bld: 176 mg/dL — ABNORMAL HIGH (ref 70–99)
Potassium: 4.1 mmol/L (ref 3.5–5.1)
Sodium: 131 mmol/L — ABNORMAL LOW (ref 135–145)

## 2019-10-18 LAB — PHOSPHORUS: Phosphorus: 5.1 mg/dL — ABNORMAL HIGH (ref 2.5–4.6)

## 2019-10-18 LAB — PREPARE RBC (CROSSMATCH)

## 2019-10-18 LAB — MAGNESIUM: Magnesium: 2.1 mg/dL (ref 1.7–2.4)

## 2019-10-18 MED ORDER — FUROSEMIDE 10 MG/ML IJ SOLN
20.0000 mg | Freq: Once | INTRAMUSCULAR | Status: AC
  Administered 2019-10-18: 20 mg via INTRAVENOUS
  Filled 2019-10-18 (×2): qty 2

## 2019-10-18 MED ORDER — ENOXAPARIN SODIUM 120 MG/0.8ML ~~LOC~~ SOLN: 120.0000 mg | Freq: Two times a day (BID) | SUBCUTANEOUS | Status: DC

## 2019-10-18 MED ORDER — RISAQUAD PO CAPS
1.0000 | ORAL_CAPSULE | Freq: Every day | ORAL | Status: DC
  Administered 2019-10-19 – 2019-10-20 (×2): 1 via ORAL
  Filled 2019-10-18 (×2): qty 1

## 2019-10-18 MED ORDER — LEVOTHYROXINE SODIUM 100 MCG PO TABS: 200.0000 ug | ORAL_TABLET | Freq: Every day | ORAL | Status: DC

## 2019-10-18 MED ORDER — DICYCLOMINE HCL 10 MG PO CAPS
10.0000 mg | ORAL_CAPSULE | Freq: Two times a day (BID) | ORAL | Status: DC
  Administered 2019-10-19 – 2019-10-20 (×3): 10 mg via ORAL
  Filled 2019-10-18 (×4): qty 1

## 2019-10-18 MED ORDER — COLCHICINE 0.6 MG PO TABS: 0.6000 mg | ORAL_TABLET | Freq: Every day | ORAL | Status: DC | PRN

## 2019-10-18 MED ORDER — METFORMIN HCL ER 500 MG PO TB24
1000.0000 mg | ORAL_TABLET | Freq: Every day | ORAL | Status: DC
  Administered 2019-10-19 – 2019-10-20 (×2): 1000 mg via ORAL
  Filled 2019-10-18 (×2): qty 2

## 2019-10-18 MED ORDER — WARFARIN SODIUM 6 MG PO TABS
6.0000 mg | ORAL_TABLET | Freq: Every day | ORAL | Status: DC
  Administered 2019-10-19: 6 mg via ORAL
  Filled 2019-10-18 (×2): qty 1

## 2019-10-18 MED ORDER — ATORVASTATIN CALCIUM 10 MG PO TABS
10.0000 mg | ORAL_TABLET | Freq: Every day | ORAL | Status: DC
  Administered 2019-10-19: 21:00:00 10 mg via ORAL
  Filled 2019-10-18: qty 1

## 2019-10-18 MED ORDER — SODIUM CHLORIDE 0.9% IV SOLUTION: Freq: Once | INTRAVENOUS | Status: AC

## 2019-10-18 NOTE — Evaluation (Signed)
Occupational Therapy Evaluation and Discharge Patient Details Name: [name] MRN: [id number] DOB: 1956/07/19 Today's Date: 10/18/2019    History of Present Illness 63 year old male with a history of hypercholesterolemia and hypothyroidism also has a history of DVT Coumadin, fell in bathroom-found to have left-sided abdominal wall hematoma   Clinical Impression   This 63 yo male admitted with fall resulting in above presents to acute OT needing increased A for LBADLs more specifically due to pain and tightness in abdomen from hematoma and also moving quite a bit slower than normal (very active with up/down ladders and doing tractor work). It will take some time for hematoma to absorb and thus Mr. Franc will need A for LBADLs for some time and his wife can help him with this. They will decide once they are at home if patient feels he needs a shower seat to sit on and will get one on their own if so. No further OT needs, we will sign off.    Follow Up Recommendations  No OT follow up;Supervision - Intermittent    Equipment Recommendations  None recommended by OT       Precautions / Restrictions Precautions Precautions: Fall(due to syncope) Precaution Comments: pt wearing an abdominal binder to help with compression for hematoma Restrictions Weight Bearing Restrictions: No      Mobility Bed Mobility Overal bed mobility: Needs Assistance Bed Mobility: Supine to Sit     Supine to sit: +2 for physical assistance;Min assist;HOB elevated     General bed mobility comments: Assist to elevate trunk into sitting.   Transfers Overall transfer level: Needs assistance Equipment used: Rolling walker (2 wheeled) Transfers: Sit to/from Stand Sit to Stand: Min assist;+2 safety/equipment         General transfer comment: Assist to bring hips up     Balance Overall balance assessment: Needs assistance   Sitting balance-Leahy Scale: Good     Standing balance support:  Bilateral upper extremity supported Standing balance-Leahy Scale: Poor Standing balance comment: UE support                            ADL either performed or assessed with clinical judgement   ADL Overall ADL's : Needs assistance/impaired Eating/Feeding: Independent;Sitting   Grooming: Set up;Sitting   Upper Body Bathing: Set up;Sitting   Lower Body Bathing: Maximal assistance Lower Body Bathing Details (indicate cue type and reason): min A +2 for safety and lines for sit<>stand Upper Body Dressing : Set up;Sitting   Lower Body Dressing: Maximal assistance Lower Body Dressing Details (indicate cue type and reason): min A +2 for safety and lines for sit<>stand Toilet Transfer: Minimal assistance;+2 for safety/equipment;RW;Regular Toilet;Grab bars   Toileting- Clothing Manipulation and Hygiene: Maximal assistance Toileting - Clothing Manipulation Details (indicate cue type and reason): min A  for safety and lines for sit<>stand with use of grab bar       General ADL Comments: Needing increased A for LB ADLs due to pain and tightness in abdomen from hematoma     Vision Patient Visual Report: No change from baseline              Pertinent Vitals/Pain Pain Assessment: Faces Faces Pain Scale: Hurts little more Pain Location: abdomen Pain Descriptors / Indicators: Grimacing;Guarding Pain Intervention(s): Limited activity within patient's tolerance;Monitored during session     Hand Dominance Right   Extremity/Trunk Assessment Upper Extremity Assessment Upper Extremity Assessment: Overall WFL for tasks assessed  Communication Communication Communication: No difficulties   Cognition Arousal/Alertness: Awake/alert Behavior During Therapy: WFL for tasks assessed/performed Overall Cognitive Status: Within Functional Limits for tasks assessed                                                Home Living Family/patient expects to  be discharged to:: Private residence Living Arrangements: Spouse/significant other Available Help at Discharge: Family;Available 24 hours/day Type of Home: House Home Access: Stairs to enter [employer] of Steps: 2 Entrance Stairs-Rails: Right;Left Home Layout: One level     Bathroom Shower/Tub: Corporate investment banker: Standard     Home Equipment: Cane - single point;Hand held shower head   Additional Comments: may have bsc available      Prior Functioning/Environment Level of Independence: Independent                 OT Problem List: Decreased range of motion;Impaired balance (sitting and/or standing);Pain         OT Goals(Current goals can be found in the care plan section) Acute Rehab OT Goals Patient Stated Goal: return home  OT Frequency:             Co-evaluation PT/OT/SLP Co-Evaluation/Treatment: Yes Reason for Co-Treatment: For patient/therapist safety PT goals addressed during session: Mobility/safety with mobility;Proper use of DME OT goals addressed during session: ADL's and self-care;Strengthening/ROM      AM-PAC OT "6 Clicks" Daily Activity     Outcome Measure Help from another person eating meals?: None Help from another person taking care of personal grooming?: A Little Help from another person toileting, which includes using toliet, bedpan, or urinal?: A Little Help from another person bathing (including washing, rinsing, drying)?: A Lot Help from another person to put on and taking off regular upper body clothing?: A Little Help from another person to put on and taking off regular lower body clothing?: A Lot 6 Click Score: 17   End of Session Equipment Utilized During Treatment: Rolling walker Nurse Communication: Mobility status  Activity Tolerance: Patient tolerated treatment well Patient left: in chair;with call bell/phone within reach;with family/visitor present  OT Visit Diagnosis: Other abnormalities  of gait and mobility (R26.89);Pain Pain - part of body: (left abdomen)                Time: [id number] OT Time Calculation (min): 28 min Charges:  OT General Charges $OT Visit: 1 Visit OT Evaluation $OT Eval Moderate Complexity: 1 Mod  Cathy OTR/L Acute [employer] Pager [phone] Office [phone] 10/18/2019, 4:26 PM

## 2019-10-18 NOTE — Evaluation (Signed)
Physical Therapy Evaluation Patient Details Name: [name] MRN: [id number] DOB: [birth year] Today's Date: 10/18/2019   History of Present Illness  63 year old male with a history of hypercholesterolemia and hypothyroidism also has a history of DVT Coumadin, fell in bathroom-found to have left-sided abdominal wall hematoma  Clinical Impression  Pt admitted with above diagnosis and presents to PT with functional limitations due to deficits listed below (See PT problem list). Pt needs skilled PT to maximize independence and safety to allow discharge to home with wife.      Follow Up Recommendations No PT follow up    Equipment Recommendations  Other (comment)(Possibly rolling walker)    Recommendations for Other Services       Precautions / Restrictions Precautions Precautions: Fall(due to syncope) Restrictions Weight Bearing Restrictions: No      Mobility  Bed Mobility Overal bed mobility: Needs Assistance Bed Mobility: Supine to Sit     Supine to sit: +2 for physical assistance;Min assist;HOB elevated     General bed mobility comments: Assist to elevate trunk into sitting.   Transfers Overall transfer level: Needs assistance Equipment used: Rolling walker (2 wheeled) Transfers: Sit to/from Stand Sit to Stand: Min assist;+2 safety/equipment         General transfer comment: Assist to bring hips up   Ambulation/Gait Ambulation/Gait assistance: Min assist;+2 safety/equipment Gait Distance (Feet): 15 Feet Assistive device: Rolling walker (2 wheeled) Gait Pattern/deviations: Step-through pattern;Decreased stride length Gait velocity: decr Gait velocity interpretation: <1.31 ft/sec, indicative of household ambulator General Gait Details: Assist for support and safety.   Stairs            Wheelchair Mobility    Modified Rankin (Stroke Patients Only)       Balance Overall balance assessment: Needs assistance Sitting-balance support: No upper  extremity supported;Feet supported Sitting balance-Leahy Scale: Good     Standing balance support: Bilateral upper extremity supported Standing balance-Leahy Scale: Poor Standing balance comment: UE support                              Pertinent Vitals/Pain Pain Assessment: Faces Faces Pain Scale: Hurts little more Pain Location: abdomen Pain Descriptors / Indicators: Grimacing;Guarding Pain Intervention(s): Limited activity within patient's tolerance;Monitored during session    Home Living Family/patient expects to be discharged to:: Private residence Living Arrangements: Spouse/significant other Available Help at Discharge: Family;Available 24 hours/day Type of Home: House Home Access: Stairs to enter Entrance Stairs-Rails: Psychiatric nurse of Steps: 2 Home Layout: One level Home Equipment: Cane - single point;Hand held shower head Additional Comments: may have bsc available    Prior Function Level of Independence: Independent               Hand Dominance        Extremity/Trunk Assessment   Upper Extremity Assessment Upper Extremity Assessment: Defer to OT evaluation    Lower Extremity Assessment Lower Extremity Assessment: Generalized weakness       Communication   Communication: No difficulties  Cognition Arousal/Alertness: Awake/alert Behavior During Therapy: WFL for tasks assessed/performed Overall Cognitive Status: Within Functional Limits for tasks assessed                                        General Comments General comments (skin integrity, edema, etc.): slightly lower BP after amb, Dyspnea2/4 with amb, SpO2 good  Exercises     Assessment/Plan    PT Assessment Patient needs continued PT services  PT Problem List Decreased strength;Decreased activity tolerance;Decreased balance;Decreased mobility       PT Treatment Interventions DME instruction;Gait training;Stair training;Functional  mobility training;Therapeutic activities;Therapeutic exercise;Balance training;Patient/family education    PT Goals (Current goals can be found in the Care Plan section)  Acute Rehab PT Goals Patient Stated Goal: return home PT Goal Formulation: With patient/family Time For Goal Achievement: 11/01/19 Potential to Achieve Goals: Good    Frequency Min 3X/week   Barriers to discharge        Co-evaluation PT/OT/SLP Co-Evaluation/Treatment: Yes Reason for Co-Treatment: For patient/therapist safety           AM-PAC PT "6 Clicks" Mobility  Outcome Measure Help needed turning from your back to your side while in a flat bed without using bedrails?: A Little Help needed moving from lying on your back to sitting on the side of a flat bed without using bedrails?: A Little Help needed moving to and from a bed to a chair (including a wheelchair)?: A Little Help needed standing up from a chair using your arms (e.g., wheelchair or bedside chair)?: A Little Help needed to walk in hospital room?: A Little Help needed climbing 3-5 steps with a railing? : A Lot 6 Click Score: 17    End of Session   Activity Tolerance: Patient limited by fatigue Patient left: in chair;with call bell/phone within reach;with family/visitor present Nurse Communication: Mobility status PT Visit Diagnosis: Other abnormalities of gait and mobility (R26.89);Unsteadiness on feet (R26.81);Muscle weakness (generalized) (M62.81)    Time: [id number] PT Time Calculation (min) (ACUTE ONLY): 28 min   Charges:   PT Evaluation $PT Eval Moderate Complexity: [place] Pager [phone] Office [place] 10/18/2019, 1:57 PM

## 2019-10-18 NOTE — Progress Notes (Signed)
Trauma Critical Care Follow Up Note  Subjective:    Overnight Issues: not very hungry, but no n/v.  Passing gas, had a small BM.  Objective:  Vital signs for last 24 hours: Temp:  [97.7 F (36.5 C)-99.3 F (37.4 C)] 98.2 F (36.8 C) (12/19 0726) Pulse Rate:  [86-103] 90 (12/19 0700) Resp:  [4-21] 14 (12/19 0700) BP: (99-142)/(60-73) 142/69 (12/19 0700) SpO2:  [94 %-100 %] 97 % (12/19 0700)  Hemodynamic parameters for last 24 hours:    Intake/Output from previous day: 12/18 0701 - 12/19 0700 In: 1341.1 [P.O.:600; I.V.:94.1; Blood:315; IV Piggyback:332] Out: 675 [Urine:675]  Intake/Output this shift: No intake/output data recorded.  Vent settings for last 24 hours:    Physical Exam:  Gen: comfortable, no distress Neuro: non-focal exam HEENT: PERRL CV: RRR Pulm: unlabored breathing Abd: soft, appropriately TTP.  Abd protuberant at baseline.  Left side more firm c/w hematoma.   GU:  NEMG Extr: wwp, no edema   Results for orders placed or performed during the hospital encounter of 10/16/19 (from the past 24 hour(s))  Glucose, capillary     Status: Abnormal   Collection Time: 10/17/19 11:39 AM  Result Value Ref Range   Glucose-Capillary 148 (H) 70 - 99 mg/dL  Basic metabolic panel     Status: Abnormal   Collection Time: 10/17/19  1:46 PM  Result Value Ref Range   Sodium 132 (L) 135 - 145 mmol/L   Potassium 4.4 3.5 - 5.1 mmol/L   Chloride 100 98 - 111 mmol/L   CO2 18 (L) 22 - 32 mmol/L   Glucose, Bld 195 (H) 70 - 99 mg/dL   BUN 26 (H) 8 - 23 mg/dL   Creatinine, Ser 2.90 (H) 0.61 - 1.24 mg/dL   Calcium 7.8 (L) 8.9 - 10.3 mg/dL   GFR calc non Af Amer 22 (L) >60 mL/min   GFR calc Af Amer 25 (L) >60 mL/min   Anion gap 14 5 - 15  CBC     Status: Abnormal   Collection Time: 10/17/19  2:33 PM  Result Value Ref Range   WBC 11.0 (H) 4.0 - 10.5 K/uL   RBC 2.03 (L) 4.22 - 5.81 MIL/uL   Hemoglobin 7.0 (L) 13.0 - 17.0 g/dL   HCT 20.7 (L) 39.0 - 52.0 %   MCV 102.0  (H) 80.0 - 100.0 fL   MCH 34.5 (H) 26.0 - 34.0 pg   MCHC 33.8 30.0 - 36.0 g/dL   RDW 13.2 11.5 - 15.5 %   Platelets 160 150 - 400 K/uL   nRBC 0.5 (H) 0.0 - 0.2 %  Glucose, capillary     Status: Abnormal   Collection Time: 10/17/19  3:32 PM  Result Value Ref Range   Glucose-Capillary 168 (H) 70 - 99 mg/dL  Prepare RBC     Status: None   Collection Time: 10/17/19  4:12 PM  Result Value Ref Range   Order Confirmation      ORDER PROCESSED BY BLOOD BANK Performed at [facility], [address], [place], [place] [address]   Sodium, urine, random     Status: None   Collection Time: 10/17/19  6:55 PM  Result Value Ref Range   Sodium, Ur <10 mmol/L  Creatinine, urine, random     Status: None   Collection Time: 10/17/19  6:55 PM  Result Value Ref Range   Creatinine, Urine 298.55 mg/dL  CBC     Status: Abnormal   Collection Time:  10/17/19  7:37 PM  Result Value Ref Range   WBC 11.7 (H) 4.0 - 10.5 K/uL   RBC 2.27 (L) 4.22 - 5.81 MIL/uL   Hemoglobin 7.4 (L) 13.0 - 17.0 g/dL   HCT 22.4 (L) 39.0 - 52.0 %   MCV 98.7 80.0 - 100.0 fL   MCH 32.6 26.0 - 34.0 pg   MCHC 33.0 30.0 - 36.0 g/dL   RDW 15.0 11.5 - 15.5 %   Platelets 161 150 - 400 K/uL   nRBC 1.0 (H) 0.0 - 0.2 %  Glucose, capillary     Status: Abnormal   Collection Time: 10/17/19  9:13 PM  Result Value Ref Range   Glucose-Capillary 161 (H) 70 - 99 mg/dL  CBC     Status: Abnormal   Collection Time: 10/18/19  2:40 AM  Result Value Ref Range   WBC 12.3 (H) 4.0 - 10.5 K/uL   RBC 2.22 (L) 4.22 - 5.81 MIL/uL   Hemoglobin 7.4 (L) 13.0 - 17.0 g/dL   HCT 21.8 (L) 39.0 - 52.0 %   MCV 98.2 80.0 - 100.0 fL   MCH 33.3 26.0 - 34.0 pg   MCHC 33.9 30.0 - 36.0 g/dL   RDW 15.6 (H) 11.5 - 15.5 %   Platelets 169 150 - 400 K/uL   nRBC 1.0 (H) 0.0 - 0.2 %  Basic metabolic panel     Status: Abnormal   Collection Time: 10/18/19  2:40 AM  Result Value Ref Range   Sodium 131 (L) 135 - 145 mmol/L   Potassium 4.1 3.5 - 5.1 mmol/L    Chloride 98 98 - 111 mmol/L   CO2 22 22 - 32 mmol/L   Glucose, Bld 176 (H) 70 - 99 mg/dL   BUN 36 (H) 8 - 23 mg/dL   Creatinine, Ser 2.46 (H) 0.61 - 1.24 mg/dL   Calcium 8.3 (L) 8.9 - 10.3 mg/dL   GFR calc non Af Amer 27 (L) >60 mL/min   GFR calc Af Amer 31 (L) >60 mL/min   Anion gap 11 5 - 15  Magnesium     Status: None   Collection Time: 10/18/19  2:40 AM  Result Value Ref Range   Magnesium 2.1 1.7 - 2.4 mg/dL  Phosphorus     Status: Abnormal   Collection Time: 10/18/19  2:40 AM  Result Value Ref Range   Phosphorus 5.1 (H) 2.5 - 4.6 mg/dL  Glucose, capillary     Status: Abnormal   Collection Time: 10/18/19  7:25 AM  Result Value Ref Range   Glucose-Capillary 147 (H) 70 - 99 mg/dL  CBC     Status: Abnormal   Collection Time: 10/18/19  7:54 AM  Result Value Ref Range   WBC 12.0 (H) 4.0 - 10.5 K/uL   RBC 2.16 (L) 4.22 - 5.81 MIL/uL   Hemoglobin 7.2 (L) 13.0 - 17.0 g/dL   HCT 20.9 (L) 39.0 - 52.0 %   MCV 96.8 80.0 - 100.0 fL   MCH 33.3 26.0 - 34.0 pg   MCHC 34.4 30.0 - 36.0 g/dL   RDW 15.7 (H) 11.5 - 15.5 %   Platelets 166 150 - 400 K/uL   nRBC 1.1 (H) 0.0 - 0.2 %    Assessment & Plan: Present on Admission: . Hemorrhage . Traumatic rectus hematoma    LOS: 2 days   Additional comments:I reviewed the patient's new clinical lab test results.    63M s/p fall  Possible syncope - likely related to dehydration  from colonoscopy prep. No additional intervention indicated.  Large L abdominal wall/rectus hematoma - received Protamine at [facility] ED. Binder, CBC in AM.  Continue to hold anticoagulation. Expectation of tamponade. Will likely start anticoagulation back tomorrow and probable d/c Monday.   ABL anemia - due to above. Stable, but quite low given his comorbidities.  Will give 2 add'l units of blood today.   Continue to monitor.  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  Unsure of baseline creatinine.   Transfer  to floor with telemetry.      Milus Height, MD FACS Surgical Oncology, General Surgery, Trauma and [facility], [place] for weekday/non holidays Check [email or web address] for coverage night/weekend/holidays  Do not use SecureChat as we are not always in the epic system to get the message.  Also, we may be off.  It is not reliable for patient care.

## 2019-10-18 NOTE — Progress Notes (Signed)
Pt was transferred to 4Np room 9. RN spoke with receiving RN. All belongings where packed up and went with the pt to the pt's new room. Pt was transferred in the bed on monitor and accopanied by RN and Tech.

## 2019-10-19 LAB — TYPE AND SCREEN
ABO/RH(D): A POS
Antibody Screen: NEGATIVE
Unit division: 0
Unit division: 0
Unit division: 0

## 2019-10-19 LAB — CBC
HCT: 23.7 % — ABNORMAL LOW (ref 39.0–52.0)
Hemoglobin: 8.2 g/dL — ABNORMAL LOW (ref 13.0–17.0)
MCH: 32.9 pg (ref 26.0–34.0)
MCHC: 34.6 g/dL (ref 30.0–36.0)
MCV: 95.2 fL (ref 80.0–100.0)
Platelets: 177 10*3/uL (ref 150–400)
RBC: 2.49 MIL/uL — ABNORMAL LOW (ref 4.22–5.81)
RDW: 16.3 % — ABNORMAL HIGH (ref 11.5–15.5)
WBC: 10.5 10*3/uL (ref 4.0–10.5)
nRBC: 1.6 % — ABNORMAL HIGH (ref 0.0–0.2)

## 2019-10-19 LAB — BASIC METABOLIC PANEL
Anion gap: 7 (ref 5–15)
BUN: 28 mg/dL — ABNORMAL HIGH (ref 8–23)
CO2: 25 mmol/L (ref 22–32)
Calcium: 8.6 mg/dL — ABNORMAL LOW (ref 8.9–10.3)
Chloride: 101 mmol/L (ref 98–111)
Creatinine, Ser: 1.19 mg/dL (ref 0.61–1.24)
GFR calc Af Amer: >60 mL/min (ref >60)
GFR calc non Af Amer: >60 mL/min (ref >60)
Glucose, Bld: 194 mg/dL — ABNORMAL HIGH (ref 70–99)
Potassium: 3.8 mmol/L (ref 3.5–5.1)
Sodium: 133 mmol/L — ABNORMAL LOW (ref 135–145)

## 2019-10-19 LAB — PROTIME-INR
INR: 1.1 (ref 0.8–1.2)
Prothrombin Time: 14.6 s (ref 11.4–15.2)

## 2019-10-19 LAB — BPAM RBC
Blood Product Expiration Date: 202101172359
Blood Product Expiration Date: 202101192359
Blood Product Expiration Date: 202101192359
ISSUE DATE / TIME: 202012181654
ISSUE DATE / TIME: 202012190853
ISSUE DATE / TIME: 202012191141
Unit Type and Rh: 6200
Unit Type and Rh: 6200
Unit Type and Rh: 6200

## 2019-10-19 LAB — GLUCOSE, CAPILLARY
Glucose-Capillary: 166 mg/dL — ABNORMAL HIGH (ref 70–99)
Glucose-Capillary: 168 mg/dL — ABNORMAL HIGH (ref 70–99)
Glucose-Capillary: 138 mg/dL — ABNORMAL HIGH (ref 70–99)
Glucose-Capillary: 148 mg/dL — ABNORMAL HIGH (ref 70–99)

## 2019-10-19 LAB — PHOSPHORUS: Phosphorus: 3.2 mg/dL (ref 2.5–4.6)

## 2019-10-19 LAB — MAGNESIUM: Magnesium: 2.7 mg/dL — ABNORMAL HIGH (ref 1.7–2.4)

## 2019-10-19 MED ORDER — WARFARIN - PHYSICIAN DOSING INPATIENT
Freq: Every day | Status: DC
  Administered 2019-10-19: 18:00:00 1

## 2019-10-19 MED ORDER — ENOXAPARIN SODIUM 120 MG/0.8ML ~~LOC~~ SOLN
120.0000 mg | Freq: Two times a day (BID) | SUBCUTANEOUS | Status: DC
  Administered 2019-10-19 – 2019-10-20 (×3): 120 mg via SUBCUTANEOUS
  Filled 2019-10-19 (×4): qty 0.8

## 2019-10-19 NOTE — Progress Notes (Signed)
Physical Therapy Treatment Patient Details Name: [name] MRN: [id number] DOB: 08-27-1956 Today's Date: 10/19/2019    History of Present Illness 63 year old male with a history of hypercholesterolemia and hypothyroidism also has a history of DVT Coumadin, fell in bathroom-found to have left-sided abdominal wall hematoma    PT Comments    Pt progressing well towards his physical therapy goals. Reports good pain control, ambulating 100 feet with walker and min guard assist. Denies dizziness/lightheadedness; BP 136/77 post mobility. Don't anticipate need for PT follow up.     Follow Up Recommendations  No PT follow up     Equipment Recommendations  Rolling walker with 5" wheels    Recommendations for Other Services       Precautions / Restrictions Precautions Precautions: Fall Restrictions Weight Bearing Restrictions: No    Mobility  Bed Mobility Overal bed mobility: Needs Assistance Bed Mobility: Rolling;Sidelying to Sit Rolling: Min guard Sidelying to sit: Min guard          Transfers Overall transfer level: Needs assistance Equipment used: Rolling walker (2 wheeled) Transfers: Sit to/from Stand Sit to Stand: Min assist         General transfer comment: Assist to bring hips up   Ambulation/Gait Ambulation/Gait assistance: Min guard Gait Distance (Feet): 100 Feet Assistive device: Rolling walker (2 wheeled) Gait Pattern/deviations: Step-through pattern;Decreased stride length Gait velocity: decr   General Gait Details: Min guard assist for stability   Stairs             Wheelchair Mobility    Modified Rankin (Stroke Patients Only)       Balance Overall balance assessment: Mild deficits observed, not formally tested                                          Cognition Arousal/Alertness: Awake/alert Behavior During Therapy: Flat affect Overall Cognitive Status: Within Functional Limits for tasks assessed                                         Exercises      General Comments        Pertinent Vitals/Pain Pain Assessment: Faces Faces Pain Scale: Hurts little more Pain Location: abdomen Pain Descriptors / Indicators: Grimacing;Guarding Pain Intervention(s): Monitored during session    Home Living                      Prior Function            PT Goals (current goals can now be found in the care plan section) Acute Rehab PT Goals Patient Stated Goal: return home Potential to Achieve Goals: Good Progress towards PT goals: Progressing toward goals    Frequency    Min 3X/week      PT Plan Current plan remains appropriate    Co-evaluation              AM-PAC PT "6 Clicks" Mobility   Outcome Measure  Help needed turning from your back to your side while in a flat bed without using bedrails?: A Little Help needed moving from lying on your back to sitting on the side of a flat bed without using bedrails?: A Little Help needed moving to and from a bed to a chair (including a wheelchair)?: A  Little Help needed standing up from a chair using your arms (e.g., wheelchair or bedside chair)?: A Little Help needed to walk in hospital room?: A Little Help needed climbing 3-5 steps with a railing? : A Lot 6 Click Score: 17    End of Session Equipment Utilized During Treatment: Gait belt Activity Tolerance: Patient tolerated treatment well Patient left: in chair;with call bell/phone within reach;with chair alarm set;with family/visitor present Nurse Communication: Mobility status PT Visit Diagnosis: Other abnormalities of gait and mobility (R26.89);Unsteadiness on feet (R26.81);Muscle weakness (generalized) (M62.81)     Time: [id number] PT Time Calculation (min) (ACUTE ONLY): 20 min  Charges:  $Therapeutic Activity: 8-22 mins                     Ellamae Sia, PT, DPT Acute Rehabilitation Services Pager [phone] Office  [phone]    Willy Eddy 10/19/2019, 2:45 PM

## 2019-10-19 NOTE — Progress Notes (Addendum)
3 Days Post-Op   Subjective/Chief Complaint: Feels ok appetite a little better    Objective: Vital signs in last 24 hours: Temp:  [98.2 F (36.8 C)-99.4 F (37.4 C)] 98.4 F (36.9 C) (12/20 0756) Pulse Rate:  [81-110] 81 (12/20 0756) Resp:  [14-20] 17 (12/19 2100) BP: (106-152)/(57-84) 128/59 (12/20 0756) SpO2:  [93 %-99 %] 96 % (12/19 2100) Last BM Date: 10/18/19  Intake/Output from previous day: 12/19 0701 - 12/20 0700 In: 1272.7 [P.O.:480; I.V.:200; Blood:592.7] Out: 1450 [Urine:1450] Intake/Output this shift: No intake/output data recorded.   Gen: comfortable, no distress Neuro: non-focal exam HEENT: PERRL CV: RRR Pulm: unlabored breathing Abd: soft, appropriately TTP.  Abd protuberant at baseline.  Left side more firm c/w hematoma.   GU:  NEMG Extr: wwp, no edema Lab Results:  Recent Labs    10/18/19 1506 10/19/19 0227  WBC 10.8* 10.5  HGB 8.7* 8.2*  HCT 24.5* 23.7*  PLT 155 177   BMET Recent Labs    10/18/19 0240 10/19/19 0227  NA 131* 133*  K 4.1 3.8  CL 98 101  CO2 22 25  GLUCOSE 176* 194*  BUN 36* 28*  CREATININE 2.46* 1.19  CALCIUM 8.3* 8.6*   PT/INR Recent Labs    10/16/19 1722 10/19/19 0227  LABPROT 16.1* 14.6  INR 1.3* 1.1   ABG No results for input(s): PHART, HCO3 in the last 72 hours.  Invalid input(s): PCO2, PO2  Studies/Results: No results found.  Anti-infectives: Anti-infectives (From admission, onward)   None      Assessment/Plan: . Hemorrhage . Traumatic rectus hematoma    LOS: 2 days   Additional comments:I reviewed the patient's new clinical lab test results.    30M s/p fall  Possible syncope - likely related to dehydration from colonoscopy prep. No additional intervention indicated.  Large L abdominal wall/rectus hematoma- received Protamine at [facility]. Binder, CBC in AM.  Continue to hold anticoagulation. Expectation of tamponade. Will likely start anticoagulation back tomorrow and probable d/c  Monday.   ABL anemia- due to above. Stable 8.2  Continue to monitor.  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  Unsure of baseline creatinine.   Continue  with telemetry.   Restarting coumadin today   LOS: 3 days    Joyice Faster Falon Huesca 10/19/2019

## 2019-10-20 LAB — GLUCOSE, CAPILLARY
Glucose-Capillary: 136 mg/dL — ABNORMAL HIGH (ref 70–99)
Glucose-Capillary: 146 mg/dL — ABNORMAL HIGH (ref 70–99)
Glucose-Capillary: 119 mg/dL — ABNORMAL HIGH (ref 70–99)

## 2019-10-20 LAB — BASIC METABOLIC PANEL
Anion gap: 9 (ref 5–15)
BUN: 23 mg/dL (ref 8–23)
CO2: 26 mmol/L (ref 22–32)
Calcium: 8.4 mg/dL — ABNORMAL LOW (ref 8.9–10.3)
Chloride: 103 mmol/L (ref 98–111)
Creatinine, Ser: 1.02 mg/dL (ref 0.61–1.24)
GFR calc Af Amer: >60 mL/min (ref >60)
GFR calc non Af Amer: >60 mL/min (ref >60)
Glucose, Bld: 147 mg/dL — ABNORMAL HIGH (ref 70–99)
Potassium: 3.8 mmol/L (ref 3.5–5.1)
Sodium: 138 mmol/L (ref 135–145)

## 2019-10-20 LAB — MAGNESIUM: Magnesium: 2.5 mg/dL — ABNORMAL HIGH (ref 1.7–2.4)

## 2019-10-20 LAB — CBC
HCT: 22.9 % — ABNORMAL LOW (ref 39.0–52.0)
Hemoglobin: 7.7 g/dL — ABNORMAL LOW (ref 13.0–17.0)
MCH: 32.6 pg (ref 26.0–34.0)
MCHC: 33.6 g/dL (ref 30.0–36.0)
MCV: 97 fL (ref 80.0–100.0)
Platelets: 198 10*3/uL (ref 150–400)
RBC: 2.36 MIL/uL — ABNORMAL LOW (ref 4.22–5.81)
RDW: 16.3 % — ABNORMAL HIGH (ref 11.5–15.5)
WBC: 8.4 10*3/uL (ref 4.0–10.5)
nRBC: 2.3 % — ABNORMAL HIGH (ref 0.0–0.2)

## 2019-10-20 LAB — PROTIME-INR
INR: 1.2 (ref 0.8–1.2)
Prothrombin Time: 14.9 s (ref 11.4–15.2)

## 2019-10-20 LAB — PHOSPHORUS: Phosphorus: 2.5 mg/dL (ref 2.5–4.6)

## 2019-10-20 MED ORDER — OXYCODONE HCL 5 MG PO TABS: 5.0000 mg | ORAL_TABLET | Freq: Four times a day (QID) | ORAL | 0 refills | Status: DC | PRN

## 2019-10-20 MED ORDER — ACETAMINOPHEN 325 MG PO TABS: 650.0000 mg | ORAL_TABLET | Freq: Four times a day (QID) | ORAL | Status: DC | PRN

## 2019-10-20 NOTE — Discharge Summary (Signed)
Patient ID: [name] [id number] 1956/02/21 63 y.o.  Admit date: 10/16/2019 Discharge date: 10/20/2019  Admitting Diagnosis: Fall Large L abdominal wall/rectus hematoma  ABL anemia   Discharge Diagnosis Patient Active Problem List   Diagnosis Date Noted   Hemorrhage 10/16/2019   Traumatic rectus hematoma 10/16/2019   Personal history of colonic polyps 08/18/2019   Obesity 08/01/2013   IBS (irritable bowel syndrome) 10/14/2012   Diarrhea 03/26/2012   Hypothyroidism 03/26/2012   Deep vein thrombosis (DVT), right 03/26/2012   Consultants None  Procedures None  Hospital Course: 63 year old male with a history of hypercholesterolemia and hypothyroidism also has a history of DVT Coumadin.  He was transitioned to Lovenox in preparation for a colonoscopy by Dr. Laural Golden.  He was during his colonoscopy prep when he fell in the bathroom.  He was evaluated at the emergency department at [facility] for possible syncope.  He was found to have a left-sided abdominal wall hematoma and I was asked to accept him in transfer for admission to the trauma service.  He complains of localized abdominal wall pain as well as nausea.  He does remember a little bit of the fall but felt he struck his right side more than his left.  Currently he complains of some discomfort in his left abdominal wall as well as nausea.  A binder was applied at [facility] and received Protamine. Syncope was felt to be 2/2 dehydration. Patient was transferred from [place] ED to [facility]. He was admitted to the trauma service. Patient did require transfusion 12/18 and 12/19. Hgb was trended and stabilized. Patient was transferred out of the unit to floor. Coumadin was restarted. Diet was advanced and tolerated. Patient worked with therapies who recommended no f/u. On 12/21, the patient was voiding well, tolerating diet, ambulating well, pain well controlled, vital signs stable, and felt stable for discharge home. I spoke with  pharmacy with plans to have patient take 9mg  of coumadin today and tomorrow and then resume normal coumadin home dose. No coverage w/ subq lovenox. He is to follow up with PCP on 12/28 for INR recheck. I spoke with patients wife, who is a NP, and understood the plan. Strict return precautions were discussed.  Physical Exam: Gen:  Alert, NAD, pleasant Card:  RRR, no M/G/R heard Pulm:  CTAB, no W/R/R, effort normal Abd: Soft, ND, mild tenderness over left anterior abdominal wall hematoma. Does not cross midline. Appears stable. Otherwise non-tender. +BS, Binder in place  Ext:  No LE edema Psych: A&Ox3  Skin: no rashes noted, warm and dry  Allergies as of 10/20/2019   No Known Allergies     Medication List    TAKE these medications   acetaminophen 325 MG tablet Commonly known as: TYLENOL Take 2 tablets (650 mg total) by mouth every 6 (six) hours as needed for mild pain.   Align 4 MG Caps Take 4 mg by mouth daily.   colchicine 0.6 MG tablet Take 0.6 mg by mouth daily as needed (Gout).   dicyclomine 10 MG capsule Commonly known as: BENTYL Take 10 mg by mouth 2 (two) times daily. What changed: Another medication with the same name was changed. Make sure you understand how and when to take each.   dicyclomine 10 MG capsule Commonly known as: BENTYL TAKE 1 CAPSULE BY MOUTH THREE TIMES A DAY BETWEEN MEALS What changed: Another medication with the same name was changed. Make sure you understand how and when to take each.  dicyclomine 10 MG capsule Commonly known as: BENTYL TAKE 1 CAPSULE BY MOUTH THREE TIMES A DAY BETWEEN MEALS What changed: See the new instructions.   dicyclomine 10 MG capsule Commonly known as: BENTYL TAKE 1 CAPSULE BY MOUTH THREE TIMES A DAY BETWEEN MEALS What changed: See the new instructions.   enoxaparin 120 MG/0.8ML injection Commonly known as: LOVENOX Inject 120 mg into the skin every 12 (twelve) hours.   Lipitor 10 MG tablet Generic drug:  atorvastatin Take 10 mg by mouth at bedtime.   metFORMIN 1000 MG (MOD) 24 hr tablet Commonly known as: GLUMETZA Take 1,000 mg by mouth daily with breakfast.   oxyCODONE 5 MG immediate release tablet Commonly known as: Oxy IR/ROXICODONE Take 1 tablet (5 mg total) by mouth every 6 (six) hours as needed for breakthrough pain.   Synthroid 200 MCG tablet Generic drug: levothyroxine Take 200 mcg by mouth daily. Take with 200 mcg tablet of levothyroxine   warfarin 6 MG tablet Commonly known as: COUMADIN Take 6 mg by mouth daily.            Durable Medical Equipment  (From admission, onward)         Start     Ordered   10/20/19 0844  For home use only DME Walker rolling  Once    Question:  Patient needs a walker to treat with the following condition  Answer:  Hematoma   10/20/19 0843           Follow-up Information    Schedule an appointment as soon as possible for a visit with Asencion Noble, MD.   Specialty: Internal Medicine Why: Please make an appointment to have your INR checked for 12/28.  Contact information: [place] [address] [phone]        [place]. Call.   Why: As needed Contact information: [address] [phone]          Signed: Alferd Apa, [facility] 10/20/2019, 9:22 AM Please see Amion for pager number during day hours 7:00am-4:30pm

## 2019-10-20 NOTE — Progress Notes (Signed)
Pt. Discharged with IV removed and discharge summary gone over. Pt.'s wife got a walker and the order was printed out and given to the pt. Pt.'s belongings were packed up and pt. was escorted out to the vehicle in wheelchair.

## 2019-10-20 NOTE — Progress Notes (Signed)
Physical Therapy Treatment Patient Details Name: [name] MRN: [id number] DOB: 08/25/1956 Today's Date: 10/20/2019    History of Present Illness 63 year old male with a history of hypercholesterolemia and hypothyroidism also has a history of DVT Coumadin, fell in bathroom-found to have left-sided abdominal wall hematoma    PT Comments    Pt very steady with mobility today, and ambulates without any physical assist. Pt's main limiting factor is abdominal discomfort, PT emphasized breathing technique and posture even amidst abdominal pain. Pt plans to d/c home today.     Follow Up Recommendations  No PT follow up     Equipment Recommendations  Rolling walker with 5" wheels    Recommendations for Other Services       Precautions / Restrictions Precautions Precautions: Fall Precaution Comments: pt wearing an abdominal binder to help with compression for hematoma Restrictions Weight Bearing Restrictions: No    Mobility  Bed Mobility               General bed mobility comments: up in chair upon PT arrival  Transfers Overall transfer level: Needs assistance Equipment used: None Transfers: Sit to/from Stand Sit to Stand: Supervision         General transfer comment: supervision for safety, pt with safe rise and steady without use of AD.  Ambulation/Gait Ambulation/Gait assistance: Min guard;Supervision Gait Distance (Feet): 115 Feet Assistive device: Rolling walker (2 wheeled) Gait Pattern/deviations: Step-through pattern;Decreased stride length Gait velocity: slightly decr   General Gait Details: min guard to supervision for safety, pt with very light use of RW and not reliant on it.   Stairs Stairs: (pt politely declines stair training)           Wheelchair Mobility    Modified Rankin (Stroke Patients Only)       Balance Overall balance assessment: Mild deficits observed, not formally tested                                          Cognition Arousal/Alertness: Awake/alert Behavior During Therapy: WFL for tasks assessed/performed Overall Cognitive Status: Within Functional Limits for tasks assessed                                        Exercises Other Exercises Other Exercises: PT discussed the importance of taking deep breaths even with abdominal pain for pulmonary health.    General Comments        Pertinent Vitals/Pain Pain Assessment: Faces Faces Pain Scale: Hurts little more Pain Location: abdomen, with erect standing Pain Descriptors / Indicators: Grimacing;Guarding Pain Intervention(s): Limited activity within patient's tolerance;Monitored during session;Repositioned    Home Living                      Prior Function            PT Goals (current goals can now be found in the care plan section) Acute Rehab PT Goals Patient Stated Goal: return home PT Goal Formulation: With patient Time For Goal Achievement: 11/01/19 Potential to Achieve Goals: Good Progress towards PT goals: Progressing toward goals    Frequency    Min 3X/week      PT Plan Current plan remains appropriate    Co-evaluation  AM-PAC PT "6 Clicks" Mobility   Outcome Measure  Help needed turning from your back to your side while in a flat bed without using bedrails?: A Little Help needed moving from lying on your back to sitting on the side of a flat bed without using bedrails?: A Little Help needed moving to and from a bed to a chair (including a wheelchair)?: None Help needed standing up from a chair using your arms (e.g., wheelchair or bedside chair)?: None Help needed to walk in hospital room?: A Little Help needed climbing 3-5 steps with a railing? : A Little 6 Click Score: 20    End of Session Equipment Utilized During Treatment: Other (comment)(abdominal binder) Activity Tolerance: Patient tolerated treatment well Patient left: with call bell/phone  within reach;Other (comment)(on toilet, pt agreees to pull assist switch next to toilet prior to mobilizing)   PT Visit Diagnosis: Other abnormalities of gait and mobility (R26.89);Unsteadiness on feet (R26.81);Muscle weakness (generalized) (M62.81)     Time: 2956-2130 PT Time Calculation (min) (ACUTE ONLY): 9 min  Charges:  $Gait Training: 8-22 mins                     Boris Engelmann E, PT Acute Rehabilitation Services Pager [phone]  Office [phone]    Katye Valek D Zasha Belleau 10/20/2019, 10:01 AM

## 2019-10-20 NOTE — Discharge Instructions (Signed)
Please take 9mg  of Coumadin on 12/21 and 12/22. Please resume your normal home dose of 6mg  starting 12/23. I would like you to call your primary care doctor today to schedule a follow up appointment. You do not require any subcutaneous Lovenox injections.   Please avoid any heavy lifting, i.e. anything greater than 10 pounds Please wear your abdominal binder at all times except for when showering If you develop any increased abdominal pain, lightheadedness, dizziness, feel faint, chest pain, or any other concerning symptoms, please call the office or present to the emergency department for urgent evaluation.    Hematoma A hematoma is a collection of blood under the skin, in an organ, in a body space, in a joint space, or in other tissue. The blood can thicken (clot) to form a lump that you can see and feel. The lump is often firm and may become sore and tender. Most hematomas get better in a few days to weeks. However, some hematomas may be serious and require medical care. Hematomas can range from very small to very large. What are the causes? This condition is caused by:  A blunt or penetrating injury.  A leakage from a blood vessel under the skin.  Some medical procedures, including surgeries, such as oral surgery, face lifts, and surgeries on the joints.  Some medical conditions that cause bleeding or bruising. There may be multiple hematomas that appear in different areas of the body. What increases the risk? You are more likely to develop this condition if:  You are an older adult.  You use blood thinners. What are the signs or symptoms?  Symptoms of this condition depend on where the hematoma is located.  Common symptoms of a hematoma that is under the skin include:  A firm lump on the body.  Pain and tenderness in the area.  Bruising. Blue, dark blue, purple-red, or yellowish skin (discoloration) may appear at the site of the hematoma if the hematoma is close to the surface  of the skin. Common symptoms of a hematoma that is deep in the tissues or body spaces may be less obvious. They include:  A collection of blood in the stomach (intra-abdominal hematoma). This may cause pain in the abdomen, weakness, fainting, and shortness of breath.  A collection of blood in the head (intracranial hematoma). This may cause a headache or symptoms such as weakness, trouble speaking or understanding, or a change in consciousness. How is this diagnosed? This condition is diagnosed based on:  Your medical history.  A physical exam.  Imaging tests, such as an ultrasound or CT scan. These may be needed if your health care provider suspects a hematoma in deeper tissues or body spaces.  Blood tests. These may be needed if your health care provider believes that the hematoma is caused by a medical condition. How is this treated? Treatment for this condition depends on the cause, size, and location of the hematoma. Treatment may include:  Doing nothing. The majority of hematomas do not need treatment as many of them go away on their own over time.  Surgery or close monitoring. This may be needed for large hematomas or hematomas that affect vital organs.  Medicines. Medicines may be given if there is an underlying medical cause for the hematoma. Follow these instructions at home: Managing pain, stiffness, and swelling   If directed, put ice on the affected area. ? Put ice in a plastic bag. ? Place a towel between your skin and the bag. ?  Leave the ice on for 20 minutes, 2-3 times a day for the first couple of days.  If directed, apply heat to the affected area after applying ice for a couple of days. Use the heat source that your health care provider recommends, such as a moist heat pack or a heating pad. ? Place a towel between your skin and the heat source. ? Leave the heat on for 20-30 minutes. ? Remove the heat if your skin turns bright red. This is especially important  if you are unable to feel pain, heat, or cold. You may have a greater risk of getting burned.  Raise (elevate) the affected area above the level of your heart while you are sitting or lying down.  If told, wrap the affected area with an elastic bandage. The bandage applies pressure (compression) to the area, which may help to reduce swelling and promote healing. Do not wrap the bandage too tightly around the affected area.  If your hematoma is on a leg or foot (lower extremity) and is painful, your health care provider may recommend crutches. Use them as told by your health care provider. General instructions  Take over-the-counter and prescription medicines only as told by your health care provider.  Keep all follow-up visits as told by your health care provider. This is important. Contact a health care provider if:  You have a fever.  The swelling or discoloration gets worse.  You develop more hematomas. Get help right away if:  Your pain is worse or your pain is not controlled with medicine.  Your skin over the hematoma breaks or starts bleeding.  Your hematoma is in your chest or abdomen and you have weakness, shortness of breath, or a change in consciousness.  You have a hematoma on your scalp that is caused by a fall or injury, and you also have: ? A headache that gets worse. ? Trouble speaking or understanding speech. ? Weakness. ? Change in alertness or consciousness. Summary  A hematoma is a collection of blood under the skin, in an organ, in a body space, in a joint space, or in other tissue.  This condition usually does not need treatment because many hematomas go away on their own over time.  Large hematomas, or those that may affect vital organs, may need surgical drainage or monitoring. If the hematoma is caused by a medical condition, medicines may be prescribed.  Get help right away if your hematoma breaks or starts to bleed, you have shortness of breath, or  you have a headache or trouble speaking after a fall. This information is not intended to replace advice given to you by your health care provider. Make sure you discuss any questions you have with your health care provider. Document Released: 05/30/2004 Document Revised: 03/21/2018 Document Reviewed: 03/21/2018 Elsevier Patient Education  2020 [employer].

## 2020-02-17 ENCOUNTER — Ambulatory Visit (INDEPENDENT_AMBULATORY_CARE_PROVIDER_SITE_OTHER): Payer: 59 | Admitting: Internal Medicine

## 2020-02-17 ENCOUNTER — Encounter (INDEPENDENT_AMBULATORY_CARE_PROVIDER_SITE_OTHER): Payer: Self-pay | Admitting: Internal Medicine

## 2020-02-17 ENCOUNTER — Other Ambulatory Visit: Payer: Self-pay

## 2020-02-17 VITALS — BP 174/77 | HR 75 | Temp 97.2°F | Ht 74.0 in | Wt 265.1 lb

## 2020-02-17 DIAGNOSIS — S301XXS Contusion of abdominal wall, sequela: Secondary | ICD-10-CM

## 2020-02-17 DIAGNOSIS — K580 Irritable bowel syndrome with diarrhea: Secondary | ICD-10-CM

## 2020-02-17 NOTE — Patient Instructions (Signed)
Notify if you have rectal bleeding 

## 2020-02-17 NOTE — Progress Notes (Signed)
Presenting complaint;  Follow-up for IBS. History of colonic adenoma.  Database and subjective:  Patient is 64 year old Caucasian male who has history of irritable bowel syndrome/diarrhea maintained on low-dose dicyclomine as well as history of colonic adenoma removed in June 2013. He was scheduled for surveillance colonoscopy on 10/16/2019.  Since he has a history of DVT he was bridged with Lovenox.  That morning he woke up with nausea and vomiting and had 2 syncopal episodes witnessed by his wife Ms. Zia Guarisco, NP.  Patient was evaluated in emergency room and head CT was unremarkable.  I saw him in emergency room and noted firm area in left upper quadrant and left mid abdomen concerning for abdominal wall hematoma.  It was confirmed with CT and was quite large.  Colonoscopy was canceled.  Patient was transferred to [facility] hospital.  He did receive blood transfusion but did not require surgical intervention/evacuation. Patient states he is finally at his baseline except he still has large lump in involving left upper and mid abdomen.  He feels it may have decreased by about 20%.  He does not have any pain unless he moves in certain way.  He says he had hemoglobin checked 6 weeks ago by Dr. Willey Blade and was 11.4.  He has had 2 episodes of gout since he was discharged. He has good appetite.  Bowels move daily.  He denies melena or rectal bleeding.  He has lost 5 pounds since his last visit 6 weeks ago. He is not having any side effects with dicyclomine.  He has received 2 doses of Covid vaccine made by [facility].   Current Medications: Outpatient Encounter Medications as of 02/17/2020  Medication Sig  . atorvastatin (LIPITOR) 10 MG tablet Take 10 mg by mouth at bedtime.   . colchicine 0.6 MG tablet Take 0.6 mg by mouth daily as needed (Gout).  Marland Kitchen dicyclomine (BENTYL) 10 MG capsule TAKE 1 CAPSULE BY MOUTH THREE TIMES A DAY BETWEEN MEALS (Patient taking differently: Take 10 mg by mouth 2 (two) times  daily. )  . levothyroxine (SYNTHROID) 200 MCG tablet Take 200 mcg by mouth daily. Take with 200 mcg tablet of levothyroxine  . metFORMIN (GLUCOPHAGE-XR) 500 MG 24 hr tablet Take 1,000 mg by mouth 2 (two) times daily.  . Probiotic Product (ALIGN) 4 MG CAPS Take 4 mg by mouth daily.   Marland Kitchen warfarin (COUMADIN) 6 MG tablet Take 6 mg by mouth daily.  . [DISCONTINUED] dicyclomine (BENTYL) 10 MG capsule TAKE 1 CAPSULE BY MOUTH THREE TIMES A DAY BETWEEN MEALS (Patient taking differently: Take 10 mg by mouth 2 (two) times daily. )  . [DISCONTINUED] acetaminophen (TYLENOL) 325 MG tablet Take 2 tablets (650 mg total) by mouth every 6 (six) hours as needed for mild pain. (Patient not taking: Reported on 02/17/2020)  . [DISCONTINUED] dicyclomine (BENTYL) 10 MG capsule Take 10 mg by mouth 2 (two) times daily.  . [DISCONTINUED] dicyclomine (BENTYL) 10 MG capsule TAKE 1 CAPSULE BY MOUTH THREE TIMES A DAY BETWEEN MEALS (Patient not taking: No sig reported)  . [DISCONTINUED] enoxaparin (LOVENOX) 120 MG/0.8ML injection Inject 120 mg into the skin every 12 (twelve) hours.  . [DISCONTINUED] metFORMIN (GLUMETZA) 1000 MG (MOD) 24 hr tablet Take 1,000 mg by mouth daily with breakfast.  . [DISCONTINUED] oxyCODONE (OXY IR/ROXICODONE) 5 MG immediate release tablet Take 1 tablet (5 mg total) by mouth every 6 (six) hours as needed for breakthrough pain. (Patient not taking: Reported on 02/17/2020)   No facility-administered encounter medications on  file as of 02/17/2020.     Objective: Blood pressure (!) 174/77, pulse 75, temperature (!) 97.2 F (36.2 C), temperature source Temporal, height 6\' 2"  (1.88 m), weight 265 lb 1.6 oz (120.2 kg). Patient is alert and in no acute distress. He is wearing facial mask. Conjunctiva is pink. Sclera is nonicteric Oropharyngeal mucosa is normal. No neck masses or thyromegaly noted. Cardiac exam with regular rhythm normal S1 and S2. No murmur or gallop noted. Lungs are clear to  auscultation. Abdominal reveals very large firm hematoma involving left upper quadrant left midabdomen extending in the flank.  It is nontender.  Rest of the abdomen is soft. No LE edema or clubbing noted.  Labs/studies Results:  Hemoglobin 6 weeks ago was 11.4 g.  Assessment:  #1.  Massive hematoma involving left upper and mid abdomen most likely resulting from Lovenox injection although trauma resulting from syncope may have made it worse.  This occurred 4 months ago.  It is slowly resolving.  It remains to be seen if he would end up with calcification.  #2.  IBS/diarrhea.  He is doing well with low-dose dicyclomine.  #3.  History of colonic adenoma.  He had single 5 mm polyp removed in June 2013.  He has not experienced rectal bleeding.  Therefore he could delay colonoscopy until this fall or next year.  Plan:  Continue dicyclomine at 10 mg by mouth twice daily. Patient will call office if he experiences rectal bleeding or abdominal pain. Office visit in 6 months at which time we will determine the timing of his colonoscopy.

## 2020-04-06 ENCOUNTER — Other Ambulatory Visit (INDEPENDENT_AMBULATORY_CARE_PROVIDER_SITE_OTHER): Payer: Self-pay | Admitting: Internal Medicine

## 2020-06-30 ENCOUNTER — Other Ambulatory Visit (INDEPENDENT_AMBULATORY_CARE_PROVIDER_SITE_OTHER): Payer: Self-pay | Admitting: Gastroenterology

## 2020-08-10 ENCOUNTER — Other Ambulatory Visit: Payer: Self-pay

## 2020-08-10 ENCOUNTER — Encounter (INDEPENDENT_AMBULATORY_CARE_PROVIDER_SITE_OTHER): Payer: Self-pay | Admitting: Internal Medicine

## 2020-08-10 ENCOUNTER — Ambulatory Visit (INDEPENDENT_AMBULATORY_CARE_PROVIDER_SITE_OTHER): Payer: 59 | Admitting: Internal Medicine

## 2020-08-10 VITALS — BP 166/76 | HR 57 | Temp 97.7°F | Ht 74.0 in | Wt 272.6 lb

## 2020-08-10 DIAGNOSIS — S301XXD Contusion of abdominal wall, subsequent encounter: Secondary | ICD-10-CM

## 2020-08-10 DIAGNOSIS — K580 Irritable bowel syndrome with diarrhea: Secondary | ICD-10-CM | POA: Diagnosis not present

## 2020-08-10 DIAGNOSIS — Z86010 Personal history of colonic polyps: Secondary | ICD-10-CM

## 2020-08-10 MED ORDER — DICYCLOMINE HCL 10 MG PO CAPS: 10.0000 mg | ORAL_CAPSULE | Freq: Three times a day (TID) | ORAL | 5 refills | Status: DC | PRN

## 2020-08-10 NOTE — Patient Instructions (Addendum)
Notify if you experience abdominal pain or if you feel hematoma is enlarging.

## 2020-08-10 NOTE — Progress Notes (Signed)
Presenting complaint;  Follow for IBS/diarrhea. History of abdominal wall hematoma.  Database and subjective:  Patient is 64 year old Caucasian male who is here for scheduled visit. He was last seen on 02/17/2020. He has history of small tubular adenoma discovered on colonoscopy of June 2013. He was scheduled for colonoscopy on 10/16/2019. 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 He was bridged with Lovenox. On 10/16/2019 he woke up with nausea and vomiting had 2 syncopal episodes. He presented to emergency room. Head CT was unremarkable. I saw him in emergency room and noted he had large abdominal wall mass concerning for hematoma. CT confirmed this. Colonoscopy was canceled. He was transferred to [facility]. He was managed conservatively. His hemoglobin dropped to 7.2 g and he required blood transfusion. Hemoglobin prior to his last visit in April was up to 11.4 g.  He states he is doing well. He states when he was off Metformin for 1 week he did not have diarrhea. He is taking dicyclomine usually twice a day which helps. He is not having any side effects with Bentyl. His bowel movements are usually explosive. He denies melena or rectal bleeding. He feels his abdominal wall hematoma is not decreasing in size as expected. The only time he has pain is if he bumps into hard surface. He does not have any pain when he turns bends or while he is in bed. He also does not have any difficulty sleeping. He has gained 7 pounds since his last visit 6 months ago. 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   Current Medications: Outpatient Encounter Medications as of 08/10/2020  Medication Sig   atorvastatin (LIPITOR) 10 MG tablet Take 10 mg by mouth at bedtime.    colchicine 0.6 MG tablet Take 0.6 mg by mouth daily as needed (Gout).   dicyclomine (BENTYL) 10 MG capsule TAKE 1 CAPSULE BY MOUTH THREE TIMES A DAY BETWEEN MEALS   levothyroxine (SYNTHROID) 200 MCG tablet  Take 200 mcg by mouth daily. Take with 200 mcg tablet of levothyroxine   metFORMIN (GLUCOPHAGE-XR) 500 MG 24 hr tablet Take 500 mg by mouth 2 (two) times daily.    Probiotic Product (ALIGN) 4 MG CAPS Take 4 mg by mouth daily.    warfarin (COUMADIN) 6 MG tablet Take 6 mg by mouth daily.   No facility-administered encounter medications on file as of 08/10/2020.     Objective: Blood pressure (!) 166/76, pulse (!) 57, temperature 97.7 F (36.5 C), temperature source Oral, height 6\' 2"  (1.88 m), weight 272 lb 9.6 oz (123.7 kg). Patient is alert and in no acute distress. Patient is wearing a mask. Conjunctiva is pink. Sclera is nonicteric Oropharyngeal mucosa is normal. No neck masses or thyromegaly noted. Cardiac exam with regular rhythm normal S1 and S2. No murmur or gallop noted. Lungs are clear to auscultation. Abdomen is protuberant and asymmetric with bulging left upper quadrant. Skin in the middle of this bulge has pigmentation. On palpation this hematoma is firm and size of soccer ball. Now it has very distinct margins. It is not tender. Liver edge is nonpalpable. No LE edema or clubbing noted.  Labs/studies Results: Lab data from 05/20/2020 WBC 5.6 H&H 13.9 and 41.4 Platelet count 175K.  INR 2.7  Glucose 133 BUN 12 and creatinine 0.89 Serum calcium 9.6 Serum sodium 140, potassium 4.6, chloride 104, CO2 23 Bilirubin 0.6, AP 59, AST 25, ALT 20, total protein 7.6 and albumin 4.8  Assessment:  #1. Abdominal wall hematoma resulting from Lovenox bridge and subsequent fall. There appears to have been slight decrease in size of hematoma since I last saw him 6 months ago. It is now firm with very distinct margins. I am concerned that hematoma may be becoming calcified. He is not having any symptoms and his hemoglobin is returned to normal. Therefore we will continue to follow.  #2. Diarrhea felt to be due to IBS and or Metformin. He is on low-dose dicyclomine.  #3. History of  colonic adenoma. He had 5 mm adenoma removed in June 2013. Surveillance colonoscopy was planned in December 2020 but had to be postponed because he developed large abdominal wall hematoma with anemia. He can wait another year or 2 before his next colonoscopy.   Plan:  New prescription for dicyclomine sent to patient's pharmacy. Patient will return for office visit in 6 months. Patient advised to call office if he has abdominal pain or if he feels hematoma is enlarging.

## 2021-02-08 ENCOUNTER — Ambulatory Visit (INDEPENDENT_AMBULATORY_CARE_PROVIDER_SITE_OTHER): Payer: 59 | Admitting: Internal Medicine

## 2021-05-26 ENCOUNTER — Other Ambulatory Visit (INDEPENDENT_AMBULATORY_CARE_PROVIDER_SITE_OTHER): Payer: Self-pay | Admitting: Internal Medicine

## 2022-02-22 ENCOUNTER — Other Ambulatory Visit (INDEPENDENT_AMBULATORY_CARE_PROVIDER_SITE_OTHER): Payer: Self-pay | Admitting: Internal Medicine

## 2022-03-21 ENCOUNTER — Encounter (INDEPENDENT_AMBULATORY_CARE_PROVIDER_SITE_OTHER): Payer: Self-pay | Admitting: *Deleted

## 2022-04-10 ENCOUNTER — Other Ambulatory Visit (INDEPENDENT_AMBULATORY_CARE_PROVIDER_SITE_OTHER): Payer: Self-pay | Admitting: Internal Medicine
